# Patient Record
Sex: Male | Born: 1960 | Race: White | Hispanic: No | Marital: Single | Smoking: Former smoker
Health system: Southern US, Community
[De-identification: ages and names within clinical notes are randomized; demographics above are authoritative.]

## PROBLEM LIST (undated history)

## (undated) DIAGNOSIS — G43909 Migraine, unspecified, not intractable, without status migrainosus: Secondary | ICD-10-CM

## (undated) DIAGNOSIS — B019 Varicella without complication: Secondary | ICD-10-CM

## (undated) DIAGNOSIS — E785 Hyperlipidemia, unspecified: Secondary | ICD-10-CM

## (undated) HISTORY — DX: Varicella without complication: B01.9

## (undated) HISTORY — DX: Hyperlipidemia, unspecified: E78.5

## (undated) HISTORY — DX: Migraine, unspecified, not intractable, without status migrainosus: G43.909

---

## 2002-06-14 ENCOUNTER — Emergency Department (HOSPITAL_COMMUNITY): Admission: EM | Admit: 2002-06-14 | Discharge: 2002-06-14 | Payer: Self-pay | Admitting: Emergency Medicine

## 2004-09-21 ENCOUNTER — Ambulatory Visit: Payer: Self-pay | Admitting: Family Medicine

## 2004-10-14 ENCOUNTER — Emergency Department (HOSPITAL_COMMUNITY): Admission: EM | Admit: 2004-10-14 | Discharge: 2004-10-14 | Payer: Self-pay | Admitting: Emergency Medicine

## 2004-12-23 ENCOUNTER — Ambulatory Visit: Payer: Self-pay | Admitting: Family Medicine

## 2005-08-24 ENCOUNTER — Ambulatory Visit: Payer: Self-pay | Admitting: Family Medicine

## 2005-12-02 ENCOUNTER — Ambulatory Visit: Payer: Self-pay | Admitting: Family Medicine

## 2006-03-17 ENCOUNTER — Ambulatory Visit: Payer: Self-pay | Admitting: Family Medicine

## 2006-04-13 ENCOUNTER — Encounter: Payer: Self-pay | Admitting: Family Medicine

## 2006-08-29 ENCOUNTER — Ambulatory Visit: Payer: Self-pay | Admitting: Family Medicine

## 2006-09-16 ENCOUNTER — Ambulatory Visit: Payer: Self-pay | Admitting: Family Medicine

## 2006-09-16 LAB — CONVERTED CEMR LAB
ALT: 27 units/L (ref 0–40)
AST: 23 units/L (ref 0–37)
Albumin: 4.3 g/dL (ref 3.5–5.2)
Alkaline Phosphatase: 65 units/L (ref 39–117)
BUN: 17 mg/dL (ref 6–23)
Basophils Absolute: 0 10*3/uL (ref 0.0–0.1)
Basophils Relative: 0 % (ref 0.0–1.0)
CO2: 30 meq/L (ref 19–32)
Calcium: 9.8 mg/dL (ref 8.4–10.5)
Chloride: 106 meq/L (ref 96–112)
Creatinine, Ser: 1 mg/dL (ref 0.4–1.5)
Eosinophil percent: 5.3 % — ABNORMAL HIGH (ref 0.0–5.0)
GFR calc non Af Amer: 86 mL/min
Glomerular Filtration Rate, Af Am: 104 mL/min/{1.73_m2}
Glucose, Bld: 95 mg/dL (ref 70–99)
HCT: 50.9 % (ref 39.0–52.0)
Hemoglobin: 17 g/dL (ref 13.0–17.0)
Lymphocytes Relative: 36 % (ref 12.0–46.0)
MCHC: 33.3 g/dL (ref 30.0–36.0)
MCV: 95.9 fL (ref 78.0–100.0)
Monocytes Absolute: 0.5 10*3/uL (ref 0.2–0.7)
Monocytes Relative: 6.8 % (ref 3.0–11.0)
Neutro Abs: 3.9 10*3/uL (ref 1.4–7.7)
Neutrophils Relative %: 51.9 % (ref 43.0–77.0)
PSA: 0.77 ng/mL (ref 0.10–4.00)
Platelets: 222 10*3/uL (ref 150–400)
Potassium: 4.1 meq/L (ref 3.5–5.1)
RBC: 5.31 M/uL (ref 4.22–5.81)
RDW: 11.8 % (ref 11.5–14.6)
Sodium: 141 meq/L (ref 135–145)
TSH: 0.41 microintl units/mL (ref 0.35–5.50)
Total Bilirubin: 1.7 mg/dL — ABNORMAL HIGH (ref 0.3–1.2)
Total Protein: 7 g/dL (ref 6.0–8.3)
WBC: 7.5 10*3/uL (ref 4.5–10.5)

## 2006-09-21 ENCOUNTER — Encounter: Payer: Self-pay | Admitting: Family Medicine

## 2006-11-16 ENCOUNTER — Encounter: Payer: Self-pay | Admitting: Family Medicine

## 2007-02-23 ENCOUNTER — Ambulatory Visit: Payer: Self-pay | Admitting: Family Medicine

## 2007-02-27 ENCOUNTER — Encounter: Payer: Self-pay | Admitting: Family Medicine

## 2007-03-07 DIAGNOSIS — L719 Rosacea, unspecified: Secondary | ICD-10-CM | POA: Insufficient documentation

## 2007-03-07 DIAGNOSIS — E785 Hyperlipidemia, unspecified: Secondary | ICD-10-CM | POA: Insufficient documentation

## 2007-03-07 DIAGNOSIS — E7210 Disorders of sulfur-bearing amino-acid metabolism, unspecified: Secondary | ICD-10-CM | POA: Insufficient documentation

## 2007-05-10 ENCOUNTER — Telehealth (INDEPENDENT_AMBULATORY_CARE_PROVIDER_SITE_OTHER): Payer: Self-pay | Admitting: *Deleted

## 2007-06-06 ENCOUNTER — Telehealth: Payer: Self-pay | Admitting: Family Medicine

## 2007-07-03 ENCOUNTER — Encounter: Payer: Self-pay | Admitting: Family Medicine

## 2007-07-03 ENCOUNTER — Telehealth (INDEPENDENT_AMBULATORY_CARE_PROVIDER_SITE_OTHER): Payer: Self-pay | Admitting: *Deleted

## 2008-06-21 ENCOUNTER — Emergency Department: Payer: Self-pay | Admitting: Emergency Medicine

## 2008-10-23 ENCOUNTER — Emergency Department (HOSPITAL_COMMUNITY): Admission: EM | Admit: 2008-10-23 | Discharge: 2008-10-24 | Payer: Self-pay | Admitting: Emergency Medicine

## 2011-02-01 LAB — POCT CARDIAC MARKERS
CKMB, poc: 1.1 ng/mL (ref 1.0–8.0)
CKMB, poc: <1 ng/mL — ABNORMAL LOW (ref 1.0–8.0)
CKMB, poc: <1 ng/mL — ABNORMAL LOW (ref 1.0–8.0)
Myoglobin, poc: 38.8 ng/mL (ref 12–200)
Myoglobin, poc: 43.3 ng/mL (ref 12–200)
Myoglobin, poc: 54.2 ng/mL (ref 12–200)
Troponin i, poc: <0.05 ng/mL (ref 0.00–0.09)
Troponin i, poc: <0.05 ng/mL (ref 0.00–0.09)
Troponin i, poc: <0.05 ng/mL (ref 0.00–0.09)

## 2011-02-01 LAB — POCT I-STAT, CHEM 8
BUN: 26 mg/dL — ABNORMAL HIGH (ref 6–23)
Calcium, Ion: 1.15 mmol/L (ref 1.12–1.32)
Chloride: 102 mEq/L (ref 96–112)
Creatinine, Ser: 1.3 mg/dL (ref 0.4–1.5)
Glucose, Bld: 92 mg/dL (ref 70–99)
HCT: 49 % (ref 39.0–52.0)
Hemoglobin: 16.7 g/dL (ref 13.0–17.0)
Potassium: 3.8 mEq/L (ref 3.5–5.1)
Sodium: 139 mEq/L (ref 135–145)
TCO2: 27 mmol/L (ref 0–100)

## 2011-03-05 NOTE — Letter (Signed)
July 03, 2007    Mr. Erik Weaver  [address]  [place], [place] [address]   RECOLLYN, RIBAS  MRN:  [id number]  /  DOB:  1960/10/31   r   Dear Mr. Erik Weaver:     It has come to my attention that you called the office asking for an  appointment for labs.  I have requested you make an appointment to see  me.  Your last NMR profile from [facility] showed you had 12-15 %  increased risk of heart attack or stroke.  You had concerns about  increasing your dose of medication but refused to talk to me on the  phone or come in to discuss the issue.  I can not treat you but just  looking at labs.  We have attempted on several occassions to speak with  you and you have not returned any of our calls.  My concern is your  health.  Please call our office and let us know the name of a Doctor  whom you would prefer to see so we can transfer your records and  facilitate addressing this health risk.  We will be able to provide care  for the next 30 days only.    Sincerely,      Lelon Perla, DO  Electronically Signed    Shawnie Dapper  DD: 07/03/2007  DT: 07/04/2007  Job #: [id number]

## 2013-04-17 ENCOUNTER — Ambulatory Visit: Payer: Self-pay | Admitting: Gastroenterology

## 2014-03-08 ENCOUNTER — Ambulatory Visit: Payer: Self-pay | Admitting: Family Medicine

## 2015-04-07 ENCOUNTER — Telehealth: Payer: Self-pay | Admitting: Family Medicine

## 2015-04-07 NOTE — Telephone Encounter (Signed)
Dr Sherryll Burger,   Request for Vitorin refill. Last office visit 03-08-14

## 2015-04-07 NOTE — Telephone Encounter (Signed)
Patient needs an office visit appointment to check his cholesterol.

## 2015-04-07 NOTE — Telephone Encounter (Signed)
Patient requesting refill on Vytorin 10/40 to be sent to [facility] [address]

## 2015-04-08 NOTE — Telephone Encounter (Signed)
Patient informed and states that he just did a full blood panel, and patient has already been given his results. Patient states he just took his last pill last night and he is completely out.

## 2015-04-09 ENCOUNTER — Other Ambulatory Visit: Payer: Self-pay | Admitting: *Deleted

## 2015-04-09 DIAGNOSIS — E7800 Pure hypercholesterolemia, unspecified: Secondary | ICD-10-CM

## 2015-04-09 MED ORDER — EZETIMIBE-SIMVASTATIN 10-40 MG PO TABS: 1.0000 | ORAL_TABLET | Freq: Every day | ORAL | Status: DC

## 2015-04-09 NOTE — Telephone Encounter (Signed)
Patient was seen on 03/14/2015. Documentation in [employer]. Okay to refill Vytorin 10-40 mg 1 tablet at bedtime.

## 2015-04-09 NOTE — Telephone Encounter (Signed)
See patient response.  I can't find any documentation of his remarks.

## 2015-07-30 ENCOUNTER — Telehealth: Payer: Self-pay | Admitting: Family Medicine

## 2015-07-30 DIAGNOSIS — E7800 Pure hypercholesterolemia, unspecified: Secondary | ICD-10-CM

## 2015-07-30 NOTE — Telephone Encounter (Signed)
Pt needs refill on [facility] 10/[facility] [address] Pt has not been seen since May 2016 for a physical.

## 2015-07-31 MED ORDER — EZETIMIBE-SIMVASTATIN 10-40 MG PO TABS: 1.0000 | ORAL_TABLET | Freq: Every day | ORAL | Status: DC

## 2015-07-31 NOTE — Telephone Encounter (Signed)
Prescription for Vytorin 10-40 sent to pharmacy

## 2015-08-01 NOTE — Telephone Encounter (Signed)
Medication has been refilled and sent to [facility] [address], patient has been notified °

## 2015-10-31 ENCOUNTER — Encounter: Payer: Self-pay | Admitting: Family Medicine

## 2015-10-31 ENCOUNTER — Ambulatory Visit (INDEPENDENT_AMBULATORY_CARE_PROVIDER_SITE_OTHER): Payer: Commercial Managed Care - HMO | Admitting: Family Medicine

## 2015-10-31 VITALS — BP 118/72 | HR 71 | Temp 98.0°F | Ht 67.0 in | Wt 195.8 lb

## 2015-10-31 DIAGNOSIS — Z23 Encounter for immunization: Secondary | ICD-10-CM

## 2015-10-31 DIAGNOSIS — E785 Hyperlipidemia, unspecified: Secondary | ICD-10-CM

## 2015-10-31 DIAGNOSIS — E669 Obesity, unspecified: Secondary | ICD-10-CM

## 2015-10-31 DIAGNOSIS — Z7189 Other specified counseling: Secondary | ICD-10-CM | POA: Diagnosis not present

## 2015-10-31 DIAGNOSIS — Z7689 Persons encountering health services in other specified circumstances: Secondary | ICD-10-CM | POA: Insufficient documentation

## 2015-10-31 NOTE — Assessment & Plan Note (Signed)
Overall patient is doing quite well. Discussed diet and exercise. Notes his only major medical issue is hyperlipidemia which is documented in the hyperlipidemia problem list. Reviewed past surgeries which are none. Reviewed family medical history and this was updated. Reviewed social history and this was updated. He was given a flu shot and Tdap. He will return in May for a physical.

## 2015-10-31 NOTE — Assessment & Plan Note (Signed)
BMI just over 30. Discussed diet and exercise at length. Sounds like patient knows exactly what he needs to do, it is just a matter of doing it more consistently. We'll continue to monitor.

## 2015-10-31 NOTE — Progress Notes (Signed)
Pre visit review using our clinic review tool, if applicable. No additional management support is needed unless otherwise documented below in the visit note. 

## 2015-10-31 NOTE — Patient Instructions (Addendum)
Nice to meet you. Please work on the consistency of your diet and exercise. You are already doing a great job. I will see you back in May for a physical.  Please let us know when you need refills.

## 2015-10-31 NOTE — Progress Notes (Signed)
Patient ID: Erik Weaver, male   DOB: 05-12-1961, 55 y.o.   MRN: [id number]  Erik AlarEric Maven Varelas, MD Phone: [phone]  Erik Weaver is a 55 y.o. male who presents today for new patient visit.  Patient presents as a new patient. Notes he was satisfied with his previous physician and wanted to have somebody that would work more on prevention with him. He notes his only major medical issue is his elevated cholesterol. Notes it was high when he is in his 30s and he was started on Vytorin. He notes he takes this daily. He also takes niacin and fish oil. He notes his father had a history of heart disease and had CABG. He denies chest pain, shortness of breath, muscle pain, and abdominal pain. He notes he monitors his diet relatively closely most the time, though over the last several months with the holidays and the weather it is difficult to do this. He typically sticks to high-protein low-fat diet. Eats 4-6 meals a day. Tries to eat vegetables and when he doesn't he drinks V8. Notes he does exercise 3-4 days a week by going to the gym and lifting weights and doing cardio. Notes in the past he has lost 30 pounds, though he has put a fair amount of it back on and the last 2 months.  Active Ambulatory Problems    Diagnosis Date Noted  . 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 Hyperlipidemia 03/07/2007  . 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 Establishing care with new doctor, encounter for 10/31/2015  . Obesity (BMI 30.0-34.9) 10/31/2015   Resolved Ambulatory Problems    Diagnosis Date Noted  . No Resolved Ambulatory Problems   Past Medical History  Diagnosis Date  . Chickenpox   . Migraines     Family History  Problem Relation Age of Onset  . Alcoholism      Parent, other relative  . Arthritis      Parent, Other relative   . Colon cancer Father   . Lung cancer      Grandparent  . Hyperlipidemia      Parent, other relative  . Heart disease Father   . Hypertension      Parent, grandparent, other relative  .  Diabetes      Parent, grandparent     Social History   Social History  . Marital Status: Single    Spouse Name: N/A  . Number of Children: N/A  . Years of Education: N/A   Occupational History  . Not on file.   Social History Main Topics  . Smoking status: Former Games developermoker  . Smokeless tobacco: Former NeurosurgeonUser    Types: Chew  . Alcohol Use: 0.0 oz/week    0 Standard drinks or equivalent per week     Comment: Infrequent  . Drug Use: No  . Sexual Activity: Not on file   Other Topics Concern  . Not on file   Social History Narrative  . No narrative on file    ROS  General:  Negative for unexplained weight loss, fever Skin: Negative for new or changing mole, sore that won't heal HEENT: Negative for trouble hearing, trouble seeing, ringing in ears, mouth sores, hoarseness, change in voice, dysphagia. CV:  Negative for chest pain, dyspnea, edema, palpitations Resp: Negative for cough, dyspnea, hemoptysis GI: Negative for nausea, vomiting, diarrhea, constipation, abdominal pain, melena, hematochezia. GU: Negative for dysuria, incontinence, urinary hesitance, hematuria, vaginal or penile discharge, polyuria, sexual difficulty, lumps in testicle or breasts MSK: Negative for muscle cramps  or aches, joint pain or swelling Neuro: Negative for headaches, weakness, numbness, dizziness, passing out/fainting Psych: Negative for depression, anxiety, memory problems  Objective  Physical Exam Filed Vitals:   10/31/15 0910  BP: 118/72  Pulse: 71  Temp: 98 F (36.7 C)    BP Readings from Last 3 Encounters:  10/31/15 118/72   Wt Readings from Last 3 Encounters:  10/31/15 195 lb 12.8 oz (88.814 kg)    Physical Exam  Constitutional: He is well-developed, well-nourished, and in no distress.  HENT:  Head: Normocephalic and atraumatic.  Right Ear: External ear normal.  Left Ear: External ear normal.  Mouth/Throat: Oropharynx is clear and moist. No oropharyngeal exudate.  Eyes:  Conjunctivae are normal. Pupils are equal, round, and reactive to light.  Neck: Neck supple.  Cardiovascular: Normal rate, regular rhythm and normal heart sounds.  Exam reveals no gallop and no friction rub.   No murmur heard. Pulmonary/Chest: Effort normal and breath sounds normal. No respiratory distress. He has no wheezes. He has no rales.  Abdominal: Soft. Bowel sounds are normal. He exhibits no distension. There is no tenderness. There is no rebound and no guarding.  Musculoskeletal: He exhibits no edema.  Lymphadenopathy:    He has no cervical adenopathy.  Neurological: He is alert. Gait normal.  Skin: Skin is warm and dry. He is not diaphoretic.  Psychiatric: Mood and affect normal.     Assessment/Plan:   Hyperlipidemia Tolerating medications well. Asymptomatic. We will request records to see what his last lipid panel was. Plan to recheck lipids at his physical in May.  Establishing care with new doctor, encounter for Overall patient is doing quite well. Discussed diet and exercise. Notes his only major medical issue is hyperlipidemia which is documented in the hyperlipidemia problem list. Reviewed past surgeries which are none. Reviewed family medical history and this was updated. Reviewed social history and this was updated. He was given a flu shot and Tdap. He will return in May for a physical.  Obesity (BMI 30.0-34.9) BMI just over 30. Discussed diet and exercise at length. Sounds like patient knows exactly what he needs to do, it is just a matter of doing it more consistently. We'll continue to monitor.    Orders Placed This Encounter  Procedures  . Flu Vaccine QUAD 36+ mos IM  . Tdap vaccine greater than or equal to 7yo IM    [employer]

## 2015-10-31 NOTE — Assessment & Plan Note (Signed)
Tolerating medications well. Asymptomatic. We will request records to see what his last lipid panel was. Plan to recheck lipids at his physical in May.

## 2015-11-28 ENCOUNTER — Other Ambulatory Visit: Payer: Self-pay

## 2015-11-28 DIAGNOSIS — E7800 Pure hypercholesterolemia, unspecified: Secondary | ICD-10-CM

## 2015-11-28 MED ORDER — EZETIMIBE-SIMVASTATIN 10-40 MG PO TABS: 1.0000 | ORAL_TABLET | Freq: Every day | ORAL | Status: DC

## 2015-11-28 NOTE — Telephone Encounter (Signed)
FYI: Pt has been scheduled to come in for fasting blood work 12/12/15

## 2015-11-28 NOTE — Telephone Encounter (Signed)
Pt states he is questioning whether he needs to have bloodwork done in order to get a refill on cholesterol medication Vytorin 10-40mg . Please advise, thanks

## 2015-11-28 NOTE — Telephone Encounter (Signed)
He does need lab work to check his cholesterol and liver function. We can refill this for him and he can come in to have the lab work done fasting.

## 2015-12-12 ENCOUNTER — Telehealth: Payer: Self-pay | Admitting: Family Medicine

## 2015-12-12 ENCOUNTER — Other Ambulatory Visit (INDEPENDENT_AMBULATORY_CARE_PROVIDER_SITE_OTHER): Payer: Commercial Managed Care - HMO

## 2015-12-12 DIAGNOSIS — E7800 Pure hypercholesterolemia, unspecified: Secondary | ICD-10-CM | POA: Diagnosis not present

## 2015-12-12 NOTE — Telephone Encounter (Signed)
Noted  

## 2015-12-12 NOTE — Telephone Encounter (Signed)
Pt called about coming in this morning to get lab work done and pt states he observed nurse asking who was next in line. Pt sounded very dissatisfied . Pt states pt observed another pt having had a tourniquet on pt arm pt did not say if lab was drawn and nurse was looking for correct labels. Pt was there for several minutes and then walked out. Pt states it was not organized. Pt requested to speak to Dr Birdie Sons.   I apologized several times to pt about his experience today and I also explained that our regular lab tech is out and his he wanted to resch his lab appt. Pt stated no not at this time or until he speaks to Dr Birdie Sons. Call pt @ [phone].

## 2015-12-12 NOTE — Telephone Encounter (Signed)
Spoke with patient about being unsatisfied with the lab services today. I explained that we did not have our regular lab tech in the clinic today so we have to get people to fill in when this happens. When we have to get people to fill in they are not as familiar with the lab and how it runs so it might seem as smoothly run as when the regular person is in there. I told him that he was more than welcome to reschedule his labs for another day to when our regular lab tech is here or he could go to the [facility] office to have labs drawn. He stated that he would think about this. He was wanting to speak with Dr. Birdie Sons about this, but I explained that Dr. Birdie Sons was seeing patient and that I would pass on his concerns. I again apologized that he was dissatisfied with his experience.

## 2015-12-19 ENCOUNTER — Other Ambulatory Visit (INDEPENDENT_AMBULATORY_CARE_PROVIDER_SITE_OTHER): Payer: Commercial Managed Care - HMO

## 2015-12-19 ENCOUNTER — Telehealth: Payer: Self-pay | Admitting: Surgical

## 2015-12-19 DIAGNOSIS — E7800 Pure hypercholesterolemia, unspecified: Secondary | ICD-10-CM | POA: Diagnosis not present

## 2015-12-19 LAB — COMPREHENSIVE METABOLIC PANEL
ALT: 34 U/L (ref 0–53)
AST: 25 U/L (ref 0–37)
Albumin: 4.4 g/dL (ref 3.5–5.2)
Alkaline Phosphatase: 66 U/L (ref 39–117)
BUN: 18 mg/dL (ref 6–23)
CO2: 27 mEq/L (ref 19–32)
CREATININE: 1.13 mg/dL (ref 0.40–1.50)
Calcium: 9.5 mg/dL (ref 8.4–10.5)
Chloride: 106 mEq/L (ref 96–112)
GFR: 71.96 mL/min (ref >60.00)
Glucose, Bld: 99 mg/dL (ref 70–99)
Potassium: 4.3 mEq/L (ref 3.5–5.1)
Sodium: 140 mEq/L (ref 135–145)
Total Bilirubin: 1.1 mg/dL (ref 0.2–1.2)
Total Protein: 6.7 g/dL (ref 6.0–8.3)

## 2015-12-19 LAB — LIPID PANEL
CHOL/HDL RATIO: 3
Cholesterol: 154 mg/dL (ref 0–200)
HDL: 60.3 mg/dL (ref >39.00)
LDL Cholesterol: 68 mg/dL (ref 0–99)
NONHDL: 93.72
Triglycerides: 130 mg/dL (ref 0.0–149.0)
VLDL: 26 mg/dL (ref 0.0–40.0)

## 2015-12-19 NOTE — Telephone Encounter (Signed)
Notified patient of lab results 

## 2016-03-05 ENCOUNTER — Encounter: Payer: Commercial Managed Care - HMO | Admitting: Family Medicine

## 2016-03-05 ENCOUNTER — Ambulatory Visit (INDEPENDENT_AMBULATORY_CARE_PROVIDER_SITE_OTHER): Payer: Commercial Managed Care - HMO | Admitting: Family Medicine

## 2016-03-05 ENCOUNTER — Encounter: Payer: Self-pay | Admitting: Family Medicine

## 2016-03-05 VITALS — BP 110/76 | HR 64 | Temp 98.2°F | Ht 67.0 in | Wt 200.8 lb

## 2016-03-05 DIAGNOSIS — Z125 Encounter for screening for malignant neoplasm of prostate: Secondary | ICD-10-CM | POA: Diagnosis not present

## 2016-03-05 DIAGNOSIS — R5382 Chronic fatigue, unspecified: Secondary | ICD-10-CM

## 2016-03-05 DIAGNOSIS — Z0000 Encounter for general adult medical examination without abnormal findings: Secondary | ICD-10-CM

## 2016-03-05 DIAGNOSIS — Z0001 Encounter for general adult medical examination with abnormal findings: Secondary | ICD-10-CM

## 2016-03-05 DIAGNOSIS — Z114 Encounter for screening for human immunodeficiency virus [HIV]: Secondary | ICD-10-CM

## 2016-03-05 DIAGNOSIS — Z1159 Encounter for screening for other viral diseases: Secondary | ICD-10-CM

## 2016-03-05 LAB — CBC
HCT: 47.9 % (ref 39.0–52.0)
Hemoglobin: 16.3 g/dL (ref 13.0–17.0)
MCHC: 34 g/dL (ref 30.0–36.0)
MCV: 93.9 fl (ref 78.0–100.0)
Platelets: 207 10*3/uL (ref 150.0–400.0)
RBC: 5.1 Mil/uL (ref 4.22–5.81)
RDW: 12.5 % (ref 11.5–15.5)
WBC: 7.5 10*3/uL (ref 4.0–10.5)

## 2016-03-05 LAB — PSA: PSA: 0.63 ng/mL (ref 0.10–4.00)

## 2016-03-05 LAB — TSH: TSH: 0.4 u[IU]/mL (ref 0.35–4.50)

## 2016-03-05 LAB — TESTOSTERONE: Testosterone: 433.37 ng/dL (ref 300.00–890.00)

## 2016-03-05 NOTE — Progress Notes (Signed)
Patient ID: Erik Weaver, male   DOB: May 01, 1961, 55 y.o.   MRN: [id number]  Erik Alar, MD Phone: [phone]  Erik Weaver is a 55 y.o. male who presents today for physical exam.  Exercise: Patient notes exercising about 50% of the month. Does some weightlifting and cardio. Diet typically consists of cereal, protein shake, or oatmeal in the morning. Nabs or protein bar for a snack. Eats lunch out. Dinner consists of healthy choice prepackaged meal, cereal, or protein shake. Tricking half-and-half sweet tea. Very few vegetables. Some fruit. Has been unable to lose weight like he previously has. Has decreased energy which he thinks feeds into this. Just does not have a desire to do as much as he used to to help his weight. Colonoscopy 2014 Tetanus vaccine earlier this year. No prior HIV or hepatitis C screening. No family history of prostate cancer. Family history of lung, brain, and stomach cancer in his maternal grandfather. Father had colon cancer diagnosed at age 50.  Active Ambulatory Problems    Diagnosis Date Noted  . 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 Hyperlipidemia 03/07/2007  . 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 Establishing care with new doctor, encounter for 10/31/2015  . Obesity (BMI 30.0-34.9) 10/31/2015  . Encounter for general adult medical examination with abnormal findings 03/05/2016   Resolved Ambulatory Problems    Diagnosis Date Noted  . No Resolved Ambulatory Problems   Past Medical History  Diagnosis Date  . Chickenpox   . Migraines     Family History  Problem Relation Age of Onset  . Alcoholism      Parent, other relative  . Arthritis      Parent, Other relative   . Colon cancer Father   . Lung cancer      Grandparent  . Hyperlipidemia      Parent, other relative  . Heart disease Father   . Hypertension      Parent, grandparent, other relative  . Diabetes      Parent, grandparent     Social History   Social History  . Marital Status: Single   Spouse Name: N/A  . Number of Children: N/A  . Years of Education: N/A   Occupational History  . Not on file.   Social History Main Topics  . Smoking status: Former Games developer  . Smokeless tobacco: Former Neurosurgeon    Types: Chew  . Alcohol Use: 0.0 oz/week    0 Standard drinks or equivalent per week     Comment: Infrequent  . Drug Use: No  . Sexual Activity: Not on file   Other Topics Concern  . Not on file   Social History Narrative    ROS  General:  Negative for nexplained weight loss, fever Skin: Negative for new or changing mole, sore that won't heal HEENT: Negative for trouble hearing, trouble seeing, ringing in ears, mouth sores, hoarseness, change in voice, dysphagia. CV:  Negative for chest pain, dyspnea, edema, palpitations Resp: Negative for cough, dyspnea, hemoptysis GI: Negative for nausea, vomiting, diarrhea, constipation, abdominal pain, melena, hematochezia. GU: Negative for dysuria, incontinence, urinary hesitance, hematuria, vaginal or penile discharge, polyuria, sexual difficulty, lumps in testicle or breasts MSK: Negative for muscle cramps or aches, joint pain or swelling Neuro: Negative for headaches, weakness, numbness, dizziness, passing out/fainting Psych: Negative for depression, anxiety, memory problems  Objective  Physical Exam Filed Vitals:   03/05/16 0821  BP: 110/76  Pulse: 64  Temp: 98.2 F (36.8 C)    BP Readings from Last  3 Encounters:  03/05/16 110/76  10/31/15 118/72   Wt Readings from Last 3 Encounters:  03/05/16 200 lb 12.8 oz (91.082 kg)  10/31/15 195 lb 12.8 oz (88.814 kg)    Physical Exam  Constitutional: He is well-developed, well-nourished, and in no distress.  HENT:  Head: Normocephalic and atraumatic.  Right Ear: External ear normal.  Left Ear: External ear normal.  Mouth/Throat: Oropharynx is clear and moist.  Eyes: Conjunctivae are normal. Pupils are equal, round, and reactive to light.  Cardiovascular: Normal rate,  regular rhythm and normal heart sounds.   Pulmonary/Chest: Effort normal and breath sounds normal.  Abdominal: Soft. Bowel sounds are normal. He exhibits no distension. There is no tenderness. There is no rebound and no guarding.  Left lateral umbilicus with small area of fat enlargement with no protrusion on Valsalva and no noted defect in the abdominal wall  Musculoskeletal: He exhibits no edema.  Neurological: He is alert. Gait normal.  Skin: Skin is warm and dry. He is not diaphoretic.  Psychiatric: Mood and affect normal.     Assessment/Plan:   Encounter for general adult medical examination with abnormal findings Overall doing well. BMI is in the obese range. Discussed diet and exercise at length. Patient will try to increase his exercise. He will change his midday meal and try to avoid the bad snacks that he states he was eating. Up-to-date on colonoscopy. Up-to-date on tetanus vaccine. Will order HIV and hepatitis C as outlined below. PSA ordered. Patient declined rectal exam today. Also notes some chronic fatigue likely related to obesity and aging though will order lab work as outlined below to evaluate further. We will see patient back in 3 months to follow-up on his weight and diet and exercise.    Orders Placed This Encounter  Procedures  . PSA  . TSH  . CBC  . Testosterone  . HIV antibody (with reflex)  . Hepatitis C Antibody    Erik AlarEric Maribell Demeo, MD [facility]

## 2016-03-05 NOTE — Progress Notes (Signed)
Pre visit review using our clinic review tool, if applicable. No additional management support is needed unless otherwise documented below in the visit note. 

## 2016-03-05 NOTE — Assessment & Plan Note (Addendum)
Overall doing well. BMI is in the obese range. Discussed diet and exercise at length. Patient will try to increase his exercise. He will change his midday meal and try to avoid the bad snacks that he states he was eating. Up-to-date on colonoscopy. Up-to-date on tetanus vaccine. Will order HIV and hepatitis C as outlined below. PSA ordered. Patient declined rectal exam today. Also notes some chronic fatigue likely related to obesity and aging though will order lab work as outlined below to evaluate further. We will see patient back in 3 months to follow-up on his weight and diet and exercise.

## 2016-03-05 NOTE — Patient Instructions (Signed)
Nice to see you. Please try to increase her exercise as you're able to. Also make changes to your midday meal 2-3 days a week to start with. We'll check some lab work and call with the results.

## 2016-03-06 LAB — HEPATITIS C ANTIBODY: HCV Ab: NEGATIVE

## 2016-03-06 LAB — HIV ANTIBODY (ROUTINE TESTING W REFLEX): HIV 1&2 Ab, 4th Generation: NONREACTIVE

## 2016-03-09 ENCOUNTER — Telehealth: Payer: Self-pay | Admitting: Family Medicine

## 2016-03-09 ENCOUNTER — Encounter: Payer: Self-pay | Admitting: Family Medicine

## 2016-03-09 NOTE — Telephone Encounter (Signed)
The generic form should be just as effective as the brand-name form. This should be okay.

## 2016-03-09 NOTE — Telephone Encounter (Signed)
Pt had his ezetimibe-simvastatin (VYTORIN) 10-40 MG tablet [facility] [address] [facility]. [facility] that [facility]illed with a [facility]. [facility] told him that Dr. Birdie SonsSonnenberg said it was ok to be filled with generic drug. Please call pt on cell phone. He wants to know if this is true.

## 2016-03-09 NOTE — Telephone Encounter (Signed)
Left message on machine for patient to return our call 

## 2016-03-10 NOTE — Telephone Encounter (Signed)
Spoke with patient and he stated that he would rather have the brand name of the prescription. I explained that would be fine if he would like to take the name brand. It will be more expensive and he might have trouble with insurance paying, but he said that he is fine with this. He has enough medication to last for the month and when he needs new prescription he will call or pharmacy will send in refill

## 2016-04-05 ENCOUNTER — Telehealth: Payer: Self-pay | Admitting: *Deleted

## 2016-04-05 DIAGNOSIS — E7800 Pure hypercholesterolemia, unspecified: Secondary | ICD-10-CM

## 2016-04-05 MED ORDER — EZETIMIBE-SIMVASTATIN 10-40 MG PO TABS: 1.0000 | ORAL_TABLET | Freq: Every day | ORAL | Status: DC

## 2016-04-05 NOTE — Telephone Encounter (Signed)
Refill of medication

## 2016-04-05 NOTE — Telephone Encounter (Signed)
Patient has requested a medication refill for Vytorin, he requested the the name brand of this medication,tier1.  Pharmacy [facility] [address]

## 2016-04-09 MED ORDER — EZETIMIBE-SIMVASTATIN 10-40 MG PO TABS: 1.0000 | ORAL_TABLET | Freq: Every day | ORAL | Status: DC

## 2016-04-09 NOTE — Addendum Note (Signed)
Addended byElise Benne: Shanetta Nicolls T on: 04/09/2016 01:54 PM   Modules accepted: Orders

## 2016-04-09 NOTE — Telephone Encounter (Signed)
Medication refill

## 2016-04-09 NOTE — Telephone Encounter (Signed)
[facility]. Patient has two,doses left.

## 2016-06-01 ENCOUNTER — Telehealth: Payer: Self-pay | Admitting: *Deleted

## 2016-06-01 NOTE — Telephone Encounter (Signed)
Patient wanted to have Dr. Birdie SonsSonnenberg opinion for the supplements tomatanine and ursolic acid.  Patient would like to discuss these supplements at his next visit

## 2016-06-01 NOTE — Telephone Encounter (Signed)
Please advise, thanks.

## 2016-06-02 NOTE — Telephone Encounter (Signed)
I am not familiar with these supplements. As with all supplements there is very little regulation or testing of the supplements and I typically do not recommend that patients take supplements unless advised by a physician due to unknown side effects and unknown compounding of the supplements. I can discuss further with the patient at his next visit if he would like. Thanks.

## 2016-06-02 NOTE — Telephone Encounter (Signed)
Spoke with the patient advised of Dr. Purvis SheffieldSonnenberg's message thanks

## 2016-06-04 ENCOUNTER — Encounter: Payer: Self-pay | Admitting: Family Medicine

## 2016-06-04 ENCOUNTER — Ambulatory Visit (INDEPENDENT_AMBULATORY_CARE_PROVIDER_SITE_OTHER): Payer: Commercial Managed Care - HMO | Admitting: Family Medicine

## 2016-06-04 DIAGNOSIS — E669 Obesity, unspecified: Secondary | ICD-10-CM

## 2016-06-04 DIAGNOSIS — E785 Hyperlipidemia, unspecified: Secondary | ICD-10-CM

## 2016-06-04 NOTE — Patient Instructions (Signed)
Nice to see you. I would avoid any supplements that are not evaluated by the FDA and approved by the FDA. Please start to work on your evening diet and begin to exercise more consistently.

## 2016-06-04 NOTE — Progress Notes (Signed)
Pre visit review using our clinic review tool, if applicable. No additional management support is needed unless otherwise documented below in the visit note. 

## 2016-06-04 NOTE — Assessment & Plan Note (Signed)
Tolerating medications. Asymptomatic. Continue current medications.

## 2016-06-04 NOTE — Progress Notes (Signed)
  Erik AlarEric Quentavious Rittenhouse, MD Phone: [phone]  Erik Weaver is a 55 y.o. male who presents today for follow-up.  HYPERLIPIDEMIA Symptoms Chest pain on exertion:  No   Leg claudication:   No Medications: Compliance- taking Vytorin, niacin, and fish oil Right upper quadrant pain- no  Muscle aches- no  Obesity: Has not made much progress with his diet. Notes he still feels terribly in the evening. Notes this will likely change in the next several weeks as several of his family members will be moving out of his house. States fairly active Friday through Sunday though not able to exercise much and we given his job. He also has questions regarding several supplements, tomatadine and ursolic acid. He has done quite a bit of research and notes these [facility] for their muscle maintaining properties and muscle building properties. He is not using these at this time.   ROS see history of present illness  Objective  Physical Exam Vitals:   06/04/16 0841  BP: 118/66  Pulse: 60  Temp: 97.9 F (36.6 C)    BP Readings from Last 3 Encounters:  06/04/16 118/66  03/05/16 110/76  10/31/15 118/72   Wt Readings from Last 3 Encounters:  06/04/16 196 lb 12.8 oz (89.3 kg)  03/05/16 200 lb 12.8 oz (91.1 kg)  10/31/15 195 lb 12.8 oz (88.8 kg)    Physical Exam  Constitutional: No distress.  Cardiovascular: Normal rate, regular rhythm and normal heart sounds.   Pulmonary/Chest: Effort normal and breath sounds normal.  Musculoskeletal: He exhibits no edema.  Neurological: He is alert. Gait normal.  Skin: He is not diaphoretic.     Assessment/Plan: Please see individual problem list.  Hyperlipidemia Tolerating medications. Asymptomatic. Continue current medications.  Obesity (BMI 30.0-34.9) Has not made many strides with his diet. He is going to work on this further. He'll continue to exercise. Advised not to use any supplements that are not  evaluated and approved by the FDA. Though did advise that he could take supplemental vitamins and amino acids.   Erik AlarEric Romari Gasparro, MD [facility]

## 2016-06-04 NOTE — Assessment & Plan Note (Addendum)
Has not made many strides with his diet. He is going to work on this further. He'll continue to exercise. Advised not to use any supplements that are not evaluated and approved by the FDA. Though did advise that he could take supplemental vitamins and amino acids.

## 2016-06-24 ENCOUNTER — Encounter: Payer: Self-pay | Admitting: Emergency Medicine

## 2016-06-24 ENCOUNTER — Emergency Department
Admission: EM | Admit: 2016-06-24 | Discharge: 2016-06-24 | Disposition: A | Discharge status: Home / Self Care | Payer: Commercial Managed Care - HMO | Attending: Student in an Organized Health Care Education/Training Program | Admitting: Student in an Organized Health Care Education/Training Program

## 2016-06-24 DIAGNOSIS — R42 Dizziness and giddiness: Secondary | ICD-10-CM | POA: Diagnosis not present

## 2016-06-24 DIAGNOSIS — Z5321 Procedure and treatment not carried out due to patient leaving prior to being seen by health care provider: Secondary | ICD-10-CM | POA: Insufficient documentation

## 2016-06-24 DIAGNOSIS — Z87891 Personal history of nicotine dependence: Secondary | ICD-10-CM | POA: Diagnosis not present

## 2016-06-24 LAB — CBC
HEMATOCRIT: 46 % (ref 40.0–52.0)
HEMOGLOBIN: 16.4 g/dL (ref 13.0–18.0)
MCH: 32.6 pg (ref 26.0–34.0)
MCHC: 35.7 g/dL (ref 32.0–36.0)
MCV: 91.1 fL (ref 80.0–100.0)
Platelets: 194 10*3/uL (ref 150–440)
RBC: 5.05 MIL/uL (ref 4.40–5.90)
RDW: 12.6 % (ref 11.5–14.5)
WBC: 11.2 10*3/uL — AB (ref 3.8–10.6)

## 2016-06-24 LAB — BASIC METABOLIC PANEL
Anion gap: 8 (ref 5–15)
BUN: 19 mg/dL (ref 6–20)
CHLORIDE: 107 mmol/L (ref 101–111)
CO2: 24 mmol/L (ref 22–32)
Calcium: 9.6 mg/dL (ref 8.9–10.3)
Creatinine, Ser: 0.97 mg/dL (ref 0.61–1.24)
GFR calc Af Amer: >60 mL/min (ref >60)
GFR calc non Af Amer: >60 mL/min (ref >60)
GLUCOSE: 118 mg/dL — AB (ref 65–99)
POTASSIUM: 3.7 mmol/L (ref 3.5–5.1)
SODIUM: 139 mmol/L (ref 135–145)

## 2016-06-24 LAB — TROPONIN I: Troponin I: <0.03 ng/mL (ref <0.03)

## 2016-06-24 NOTE — ED Triage Notes (Signed)
Pt states was at work when he became light headed. Pt states "over the last few weeks I've had a shooting pain in the L side of my head". Pt states this afternoon he began to feel light headed with a pressure behind his eyes. Pt states he has continued to feel light headed since this afternoon.

## 2016-06-25 ENCOUNTER — Other Ambulatory Visit: Payer: Self-pay | Admitting: Unknown Physician Specialty

## 2016-06-25 ENCOUNTER — Telehealth: Payer: Self-pay | Admitting: Emergency Medicine

## 2016-06-25 ENCOUNTER — Ambulatory Visit
Admission: RE | Admit: 2016-06-25 | Discharge: 2016-06-25 | Disposition: A | Discharge status: Home / Self Care | Payer: Commercial Managed Care - HMO | Source: Ambulatory Visit | Attending: Unknown Physician Specialty | Admitting: Unknown Physician Specialty

## 2016-06-25 DIAGNOSIS — R51 Headache: Secondary | ICD-10-CM | POA: Insufficient documentation

## 2016-06-25 DIAGNOSIS — R519 Headache, unspecified: Secondary | ICD-10-CM

## 2016-06-25 NOTE — Telephone Encounter (Signed)
Called patient due to lwot to inquire about condition and follow up plans. Left message with my number. 

## 2016-07-02 ENCOUNTER — Encounter: Payer: Self-pay | Admitting: Family Medicine

## 2016-07-02 ENCOUNTER — Ambulatory Visit (INDEPENDENT_AMBULATORY_CARE_PROVIDER_SITE_OTHER): Payer: Commercial Managed Care - HMO | Admitting: Family Medicine

## 2016-07-02 DIAGNOSIS — R519 Headache, unspecified: Secondary | ICD-10-CM

## 2016-07-02 DIAGNOSIS — R51 Headache: Secondary | ICD-10-CM

## 2016-07-02 NOTE — Assessment & Plan Note (Signed)
Patient with recent worsening of recurrence of headaches with left-sided twinges. Has had an extensive evaluation with no obvious cause. No acute changes on CT scan of his head last week. The description sounds as though it could be trigeminal neuralgia. Given that they've resolved with discontinuing Coke intake it could be that it was a reaction to some compound in the soft drink. Discussed given that he is asymptomatic at this time continuing to monitor. If they recur could consider treatment for trigeminal neuralgia versus referral to neurology. Given return precautions.

## 2016-07-02 NOTE — Progress Notes (Signed)
  Marikay AlarEric Karnell Vanderloop, MD Phone: [phone]  Glendell DockerRandall K Kalman is a 55 y.o. male who presents today for follow-up.  Patient notes he was evaluated last week in the emergency room and then at [facility] for left-sided headaches. Notes he has a long history of headaches and had significant migraines when he was a child. Occasionally gets migraines and tension headaches now. Intermittently over the last couple of years he will get a twinge of shooting discomfort in the left side of his head. Notes this started occurring more frequently about 10 days ago. Had some blurring of vision and fogginess with this. No nausea or vomiting. No numbness or weakness with this. He had lab work in the ED that revealed a minimally elevated white count though other workup was unremarkable. He did not wait for evaluation with the physician though he [facility] the next day and had a CT scan of his head that was negative for acute changes. Patient started thinking about any dietary changes after his workup was negative and noted he has been drinking a lot of Coke zero recently and has since discontinued this and has not had recurrence of this type of headache or any other headaches.   ROS see history of present illness  Objective  Physical Exam Vitals:   07/02/16 1114  BP: 118/72  Pulse: 73  Temp: 98.4 F (36.9 C)    BP Readings from Last 3 Encounters:  07/02/16 118/72  06/24/16 130/80  06/04/16 118/66   Wt Readings from Last 3 Encounters:  07/02/16 200 lb 12.8 oz (91.1 kg)  06/24/16 195 lb (88.5 kg)  06/04/16 196 lb 12.8 oz (89.3 kg)    Physical Exam  Constitutional: No distress.  HENT:  Head: Normocephalic and atraumatic.  Mouth/Throat: Oropharynx is clear and moist. No oropharyngeal exudate.  Eyes: Conjunctivae are normal. Pupils are equal, round, and reactive to light.  Cardiovascular: Normal rate, regular rhythm and normal heart sounds.   Pulmonary/Chest: Effort normal  and breath sounds normal.  Neurological: He is alert.  CN 2-12 intact, 5/5 strength in bilateral biceps, triceps, grip, quads, hamstrings, plantar and dorsiflexion, sensation to light touch intact in bilateral UE and LE, normal gait, 2+ patellar reflexes  Skin: Skin is warm and dry. He is not diaphoretic.     Assessment/Plan: Please see individual problem list.  Headache Patient with recent worsening of recurrence of headaches with left-sided twinges. Has had an extensive evaluation with no obvious cause. No acute changes on CT scan of his head last week. The description sounds as though it could be trigeminal neuralgia. Given that they've resolved with discontinuing Coke intake it could be that it was a reaction to some compound in the soft drink. Discussed given that he is asymptomatic at this time continuing to monitor. If they recur could consider treatment for trigeminal neuralgia versus referral to neurology. Given return precautions.  Marikay AlarEric Blima Jaimes, MD [facility] [place]

## 2016-07-02 NOTE — Patient Instructions (Signed)
Nice to see you. Please continue to monitor for recurrence of the headaches. If they recur please let us know. If you develop numbness, weakness, vision changes, sudden onset worst headache of her life, or any new or changing symptoms please seek medical attention.

## 2016-07-02 NOTE — Progress Notes (Signed)
Pre visit review using our clinic review tool, if applicable. No additional management support is needed unless otherwise documented below in the visit note. 

## 2016-08-06 ENCOUNTER — Other Ambulatory Visit: Payer: Self-pay | Admitting: Family Medicine

## 2016-08-06 DIAGNOSIS — E7800 Pure hypercholesterolemia, unspecified: Secondary | ICD-10-CM

## 2016-08-06 NOTE — Telephone Encounter (Signed)
Please advise on refill.

## 2016-09-24 ENCOUNTER — Other Ambulatory Visit: Payer: Self-pay | Admitting: Family Medicine

## 2016-09-24 DIAGNOSIS — E7800 Pure hypercholesterolemia, unspecified: Secondary | ICD-10-CM

## 2016-09-24 NOTE — Telephone Encounter (Signed)
Please advise 

## 2016-09-24 NOTE — Telephone Encounter (Signed)
Pt called and stated that he spoke with his [employer] called and stated that as of the first of the year they will no longer be covering VYTORIN 10-40 MG tablet. He would like to know that the alternative would be for this, he stated that he would like it to have the same ingredients as current medication. Please advise, thank you!  Call pt @ [phone]

## 2016-09-25 NOTE — Telephone Encounter (Signed)
The alternative is to prescribe the individual components of that medication. These would be simvastatin and Zetia. I can send these to his pharmacy if he would like.

## 2016-09-28 NOTE — Telephone Encounter (Signed)
LM for patient to return call.

## 2016-09-29 MED ORDER — EZETIMIBE-SIMVASTATIN 10-40 MG PO TABS: 1.0000 | ORAL_TABLET | Freq: Every day | ORAL | 0 refills | Status: DC

## 2016-09-29 MED ORDER — EZETIMIBE 10 MG PO TABS: 10.0000 mg | ORAL_TABLET | Freq: Every day | ORAL | 3 refills | Status: DC

## 2016-09-29 MED ORDER — SIMVASTATIN 40 MG PO TABS: 40.0000 mg | ORAL_TABLET | Freq: Every day | ORAL | 3 refills | Status: DC

## 2016-09-29 NOTE — Telephone Encounter (Signed)
Noted. Patient should start the Zetia and simvastatin once he runs out of the Vytorin. He should not take them at the same time. Thanks.

## 2016-09-29 NOTE — Telephone Encounter (Signed)
Patient is fine with doing the two medications. That will not start until January. He would like the Vytorin sent in as well for this month.

## 2016-10-28 ENCOUNTER — Other Ambulatory Visit: Payer: Self-pay | Admitting: Family Medicine

## 2016-10-28 DIAGNOSIS — E7800 Pure hypercholesterolemia, unspecified: Secondary | ICD-10-CM

## 2016-10-28 NOTE — Telephone Encounter (Signed)
Patient states the vytorin is not covered but he was told that there is a generic combo pill instead of taking the zetia and simvastatin separate. Informed patient that there is not a generic combo but patient states he will check with the pharmacist that informed him of this first

## 2016-10-28 NOTE — Telephone Encounter (Signed)
There appears to be a generic option in epic. We will attempt to send this in for the patient.

## 2016-10-28 NOTE — Telephone Encounter (Signed)
Please find out if he needs the vytorin or if he needs refills on the zetia and simvastatin.

## 2016-10-28 NOTE — Telephone Encounter (Signed)
Last filled 09/29/16 30 0rf

## 2016-10-28 NOTE — Telephone Encounter (Signed)
Left message to return call 

## 2016-11-09 IMAGING — CT CT HEAD W/O CM
3 series · 16 of 46 positions shown, 19 images · non-contrast
Comparison: None.

CLINICAL DATA: Headache for 3 months

EXAM:
CT HEAD WITHOUT CONTRAST
TECHNIQUE: Contiguous axial images were obtained from the base of the skull
through the vertex without intravenous contrast.

[Series 2: head wo · axial · 0.43mm/px · z∈[-51,+69]mm · 10 of 29 slices shown, 13 images]
[im 3/29  brain]
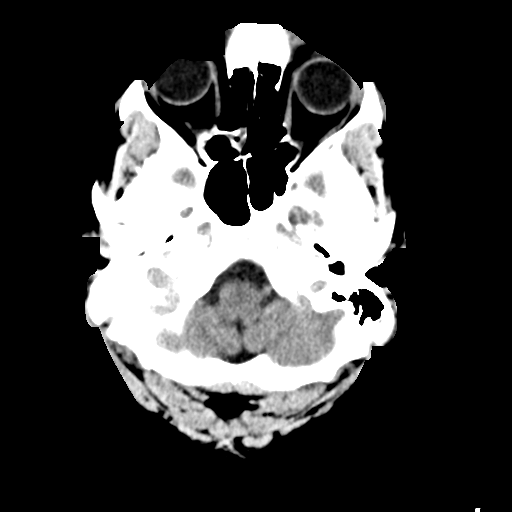
[im 3/29  bone]
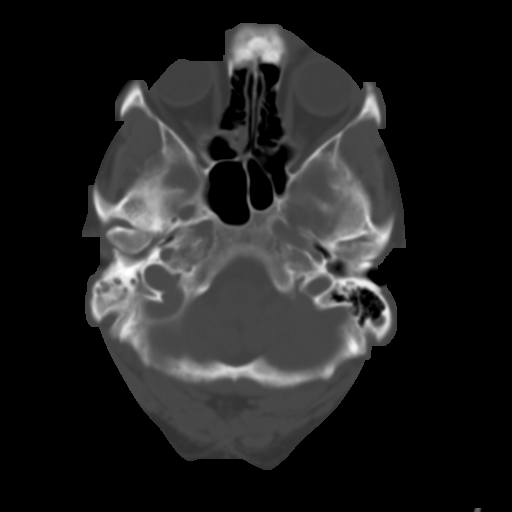
[im 6/29  brain]
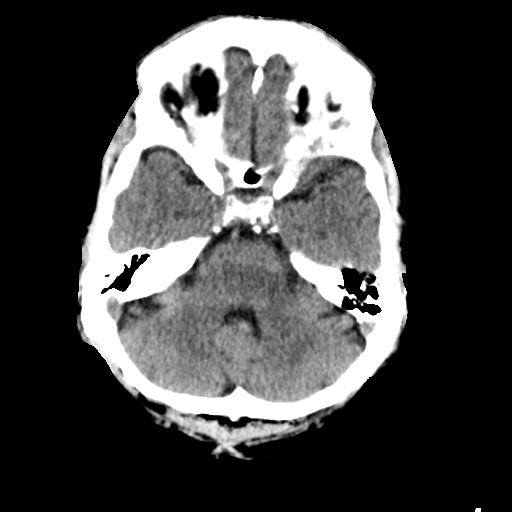
[im 8/29  brain]
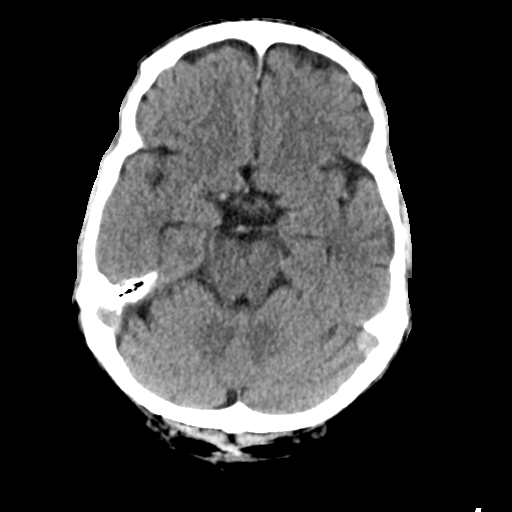
[im 11/29  brain]
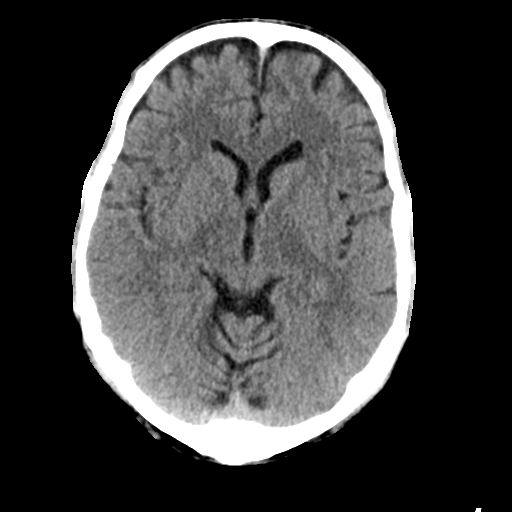
[im 14/29  brain]
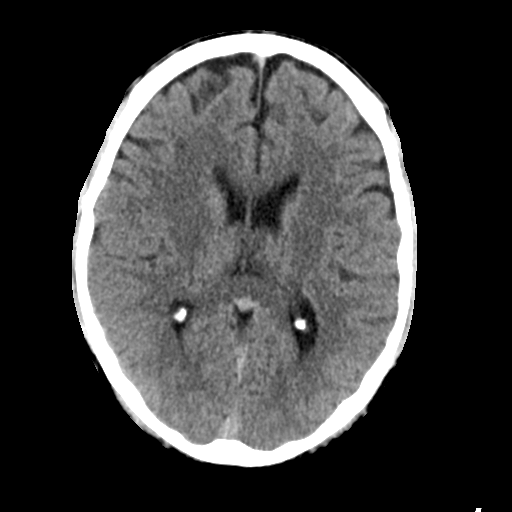
[im 14/29  bone]
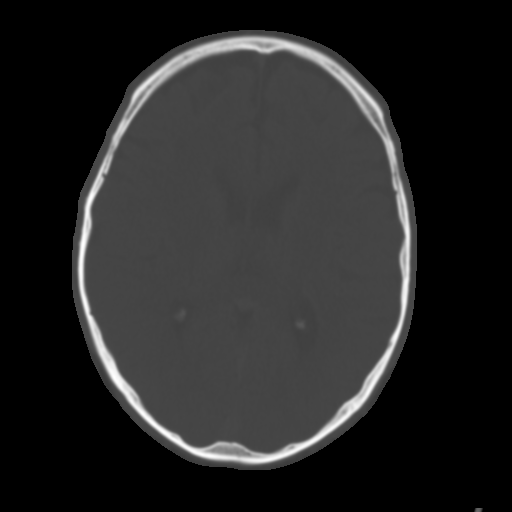
[im 16/29  brain]
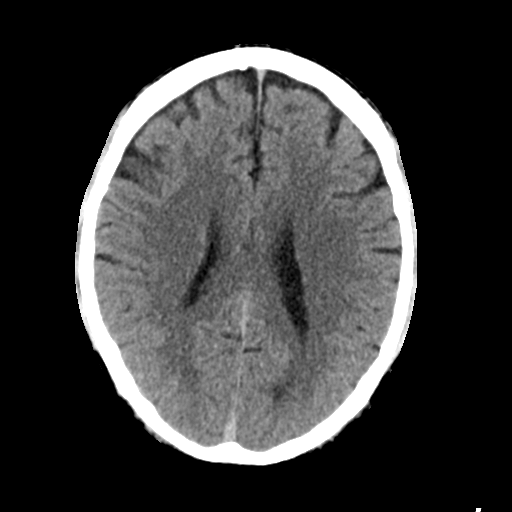
[im 19/29  brain]
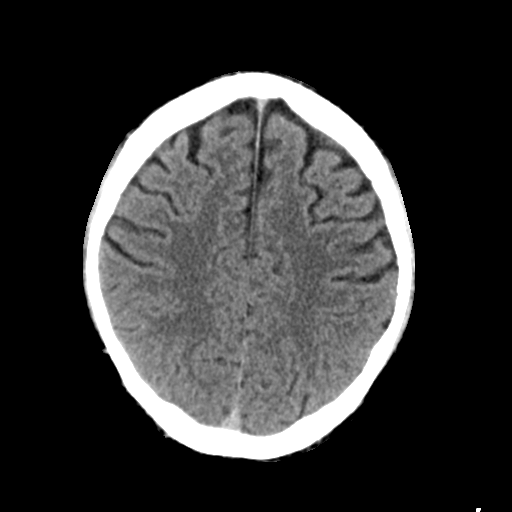
[im 22/29  brain]
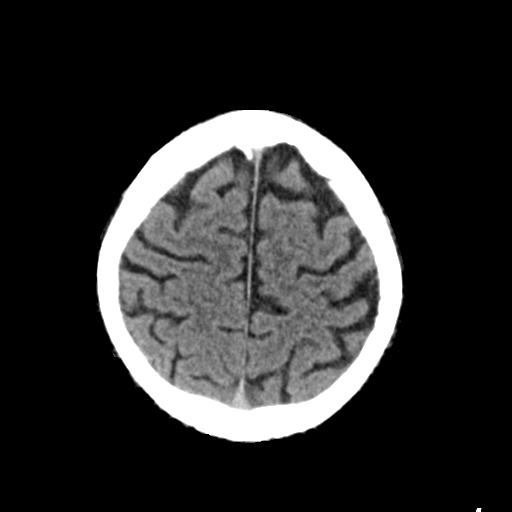
[im 24/29  brain]
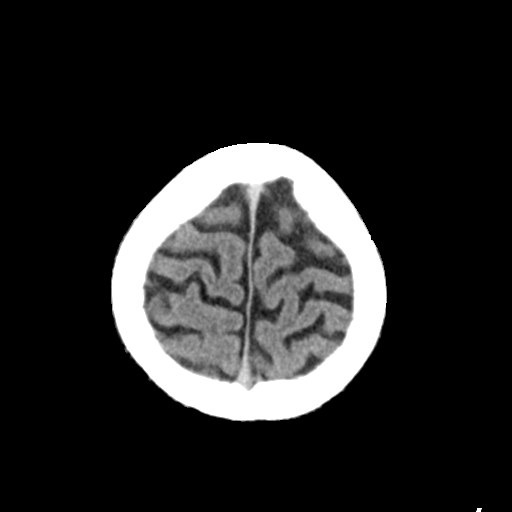
[im 24/29  bone]
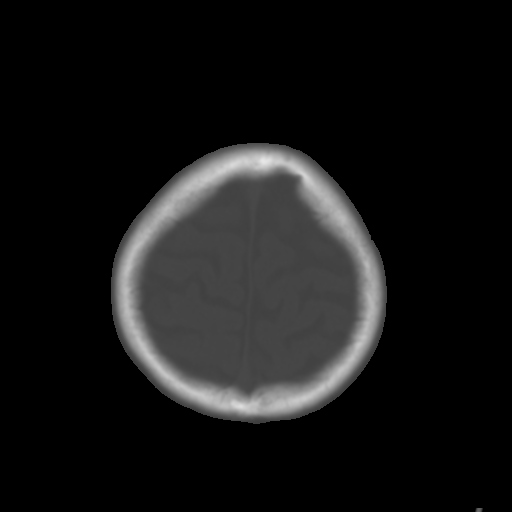
[im 27/29  brain]
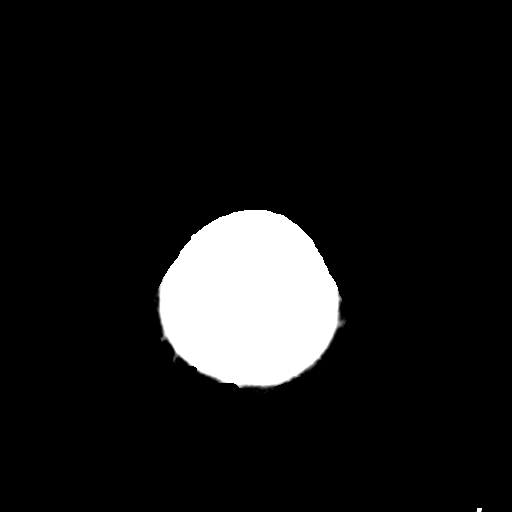

[Series 4: coronal soft tissue · coronal · 0.31mm/px · 3 of 65 slices shown]
[im 22/65  brain]
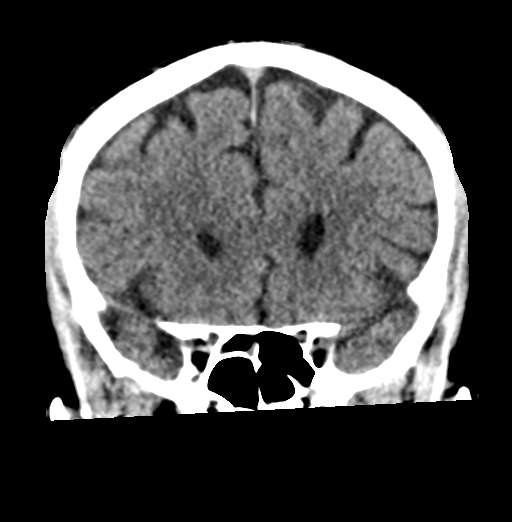
[im 29/65  brain]
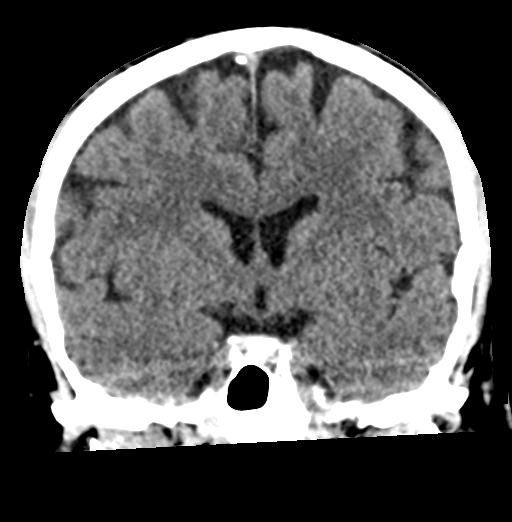
[im 36/65  brain]
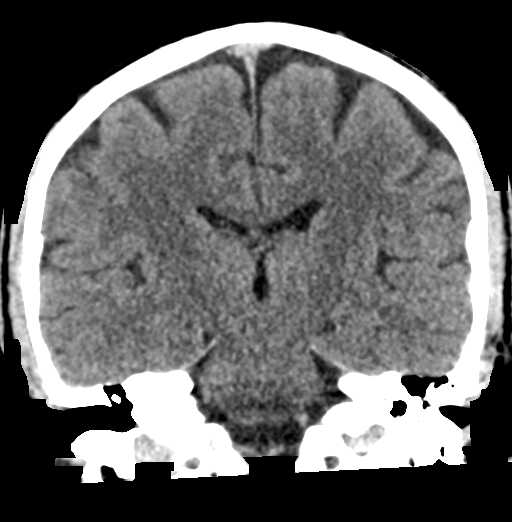

[Series 5: sagittal soft tissue · sagittal · 0.31mm/px · 3 of 53 slices shown]
[im 18/53  brain]
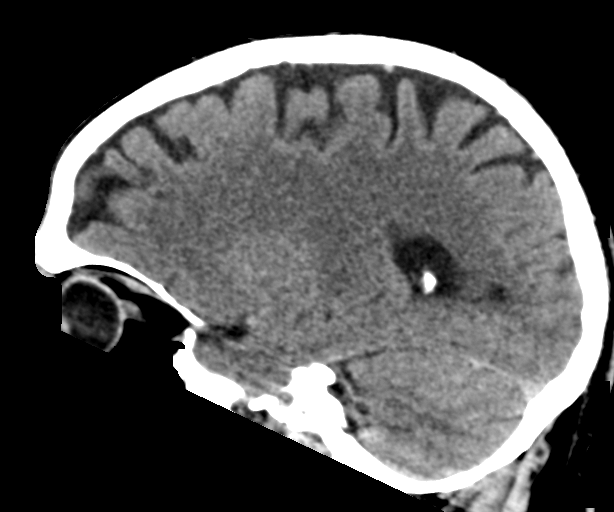
[im 27/53  brain]
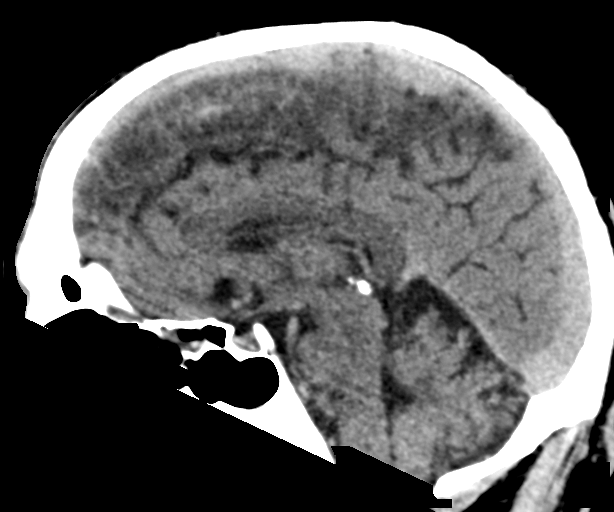
[im 35/53  brain]
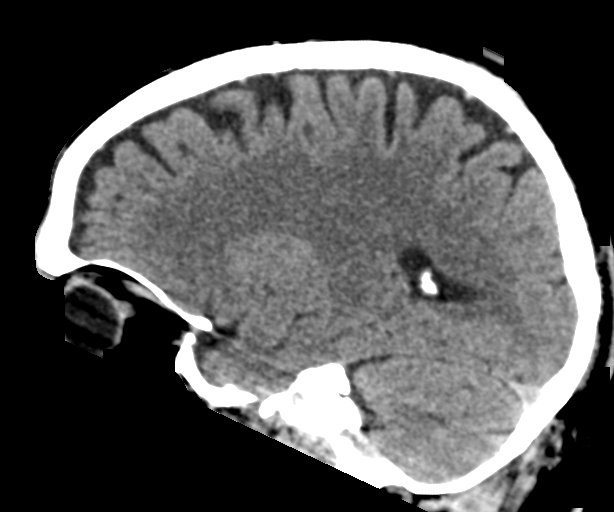

[16 of 46 positions shown; findings below may reference images not displayed]

FINDINGS: Brain: No evidence of acute infarction, hemorrhage, hydrocephalus,
extra-axial collection or mass lesion/mass effect. Mild generalized
cerebral atrophy.

Vascular: No hyperdense vessel or unexpected calcification.

Skull: Normal. Negative for fracture or focal lesion.

Sinuses/Orbits: No acute finding.

Other: None.
IMPRESSION: No acute intracranial pathology.

## 2016-12-10 ENCOUNTER — Encounter: Payer: Self-pay | Admitting: Family Medicine

## 2016-12-10 ENCOUNTER — Ambulatory Visit (INDEPENDENT_AMBULATORY_CARE_PROVIDER_SITE_OTHER): Payer: Commercial Managed Care - HMO | Admitting: Family Medicine

## 2016-12-10 VITALS — BP 110/64 | HR 82 | Temp 98.8°F | Resp 16 | Wt 195.2 lb

## 2016-12-10 DIAGNOSIS — E669 Obesity, unspecified: Secondary | ICD-10-CM | POA: Diagnosis not present

## 2016-12-10 DIAGNOSIS — J309 Allergic rhinitis, unspecified: Secondary | ICD-10-CM

## 2016-12-10 DIAGNOSIS — M79671 Pain in right foot: Secondary | ICD-10-CM | POA: Diagnosis not present

## 2016-12-10 DIAGNOSIS — E785 Hyperlipidemia, unspecified: Secondary | ICD-10-CM

## 2016-12-10 DIAGNOSIS — M79673 Pain in unspecified foot: Secondary | ICD-10-CM | POA: Insufficient documentation

## 2016-12-10 NOTE — Assessment & Plan Note (Signed)
Suspect congestion and postnasal drip related to allergic rhinitis. Discussed Flonase or nasal saline. Continuing to monitor.

## 2016-12-10 NOTE — Progress Notes (Signed)
Pre-visit discussion using our clinic review tool. No additional management support is needed unless otherwise documented below in the visit note.  

## 2016-12-10 NOTE — Assessment & Plan Note (Signed)
He notes his clothes are fitting better. Weight is down 5 pounds. Encouraged to continue diet and exercise.

## 2016-12-10 NOTE — Patient Instructions (Signed)
Nice to see you. Please continue to work on diet and exercise. We will have you return for fasting lab work and next 1-2 weeks at her convenience. Please monitor the right foot and if this worsens please let us know. You can try nasal saline or Flonase for your congestion.

## 2016-12-10 NOTE — Assessment & Plan Note (Signed)
He will return for fasting lab work. Continue current medications.

## 2016-12-10 NOTE — Progress Notes (Signed)
  Tommi Rumps, MD Phone: [phone]  Erik Weaver is a 56 y.o. male who presents today for f/u.  HYPERLIPIDEMIA Symptoms Chest pain on exertion:  no   Leg claudication:   no Medications: Compliance- taking vytorin Right upper quadrant pain- no  Muscle aches- no  Obesity: He notes he has stepped up his game with exercise. He is watching what he eats as well. He works out 4 days a week. He typically eats oatmeal and blueberries and protein for breakfast. Egg whites as a snack. Vegetables at lunch. Proteins at night. Does note he is somewhat stressed at work and this does change his eating habits at times.  Patient notes over the last 5 years he has noted some slight increase in nasal congestion and postnasal drip. Reports he was exposed to concrete dust and asphalt dust in the past and wonders if this is related. No chest congestion. No cough.  Right lateral foot pain: Patient notes he noticed this gradually after doing box jumps and leg presses. It was a burning sensation on the lateral aspect of his foot. It was fairly inflamed previously though not very bad at this time. No pain today.   PMH: Former smoker   ROS see history of present illness  Objective  Physical Exam Vitals:   12/10/16 0809  BP: 110/64  Pulse: 82  Resp: 16  Temp: 98.8 F (37.1 C)    BP Readings from Last 3 Encounters:  12/10/16 110/64  07/02/16 118/72  06/24/16 130/80   Wt Readings from Last 3 Encounters:  12/10/16 195 lb 3.2 oz (88.5 kg)  07/02/16 200 lb 12.8 oz (91.1 kg)  06/24/16 195 lb (88.5 kg)    Physical Exam  Constitutional: No distress.  Cardiovascular: Normal rate, regular rhythm and normal heart sounds.   Pulmonary/Chest: Effort normal and breath sounds normal.  Musculoskeletal:  Bilateral feet with no swelling, warmth, or erythema, no tenderness on the lateral aspect and plantar aspect of feet, 2+ DP pulses bilaterally  Neurological: He is alert. Gait normal.  Skin: Skin is  warm and dry. He is not diaphoretic.     Assessment/Plan: Please see individual problem list.  Hyperlipidemia He will return for fasting lab work. Continue current medications.  Obesity (BMI 30.0-34.9) He notes his clothes are fitting better. Weight is down 5 pounds. Encouraged to continue diet and exercise.  Allergic rhinitis Suspect congestion and postnasal drip related to allergic rhinitis. Discussed Flonase or nasal saline. Continuing to monitor.  Foot pain Somewhat nonspecific foot pain. No pain today. No specific injury. Could be plantar fasciitis or soft tissue injury. Discussed icing and anti-inflammatories. He'll continue to monitor.   Orders Placed This Encounter  Procedures  . Lipid Profile    Standing Status:   Future    Standing Expiration Date:   12/10/2017  . Comp Met (CMET)    Standing Status:   Future    Standing Expiration Date:   12/10/2017    Tommi Rumps, MD [place]

## 2016-12-10 NOTE — Assessment & Plan Note (Signed)
Somewhat nonspecific foot pain. No pain today. No specific injury. Could be plantar fasciitis or soft tissue injury. Discussed icing and anti-inflammatories. He'll continue to monitor.

## 2017-03-04 ENCOUNTER — Other Ambulatory Visit (INDEPENDENT_AMBULATORY_CARE_PROVIDER_SITE_OTHER): Payer: Commercial Managed Care - HMO

## 2017-03-04 DIAGNOSIS — E785 Hyperlipidemia, unspecified: Secondary | ICD-10-CM | POA: Diagnosis not present

## 2017-03-04 NOTE — Addendum Note (Signed)
Addended by: Penne LashWIGGINS, Tacey Dimaggio N on: 03/04/2017 08:34 AM   Modules accepted: Orders

## 2017-03-05 LAB — COMPREHENSIVE METABOLIC PANEL
ALK PHOS: 73 IU/L (ref 39–117)
ALT: 26 IU/L (ref 0–44)
AST: 22 IU/L (ref 0–40)
Albumin/Globulin Ratio: 1.8 (ref 1.2–2.2)
Albumin: 4.3 g/dL (ref 3.5–5.5)
BUN / CREAT RATIO: 18 (ref 9–20)
BUN: 20 mg/dL (ref 6–24)
Bilirubin Total: 0.9 mg/dL (ref 0.0–1.2)
CALCIUM: 9.5 mg/dL (ref 8.7–10.2)
CO2: 24 mmol/L (ref 18–29)
CREATININE: 1.14 mg/dL (ref 0.76–1.27)
Chloride: 104 mmol/L (ref 96–106)
GFR calc Af Amer: 84 mL/min/{1.73_m2} (ref >59)
GFR calc non Af Amer: 73 mL/min/{1.73_m2} (ref >59)
GLUCOSE: 98 mg/dL (ref 65–99)
Globulin, Total: 2.4 g/dL (ref 1.5–4.5)
Potassium: 4.3 mmol/L (ref 3.5–5.2)
Sodium: 141 mmol/L (ref 134–144)
Total Protein: 6.7 g/dL (ref 6.0–8.5)

## 2017-03-05 LAB — LIPID PANEL
CHOLESTEROL TOTAL: 141 mg/dL (ref 100–199)
Chol/HDL Ratio: 2.5 ratio (ref 0.0–5.0)
HDL: 57 mg/dL (ref >39)
LDL CALC: 70 mg/dL (ref 0–99)
TRIGLYCERIDES: 68 mg/dL (ref 0–149)
VLDL CHOLESTEROL CAL: 14 mg/dL (ref 5–40)

## 2017-03-11 ENCOUNTER — Ambulatory Visit (INDEPENDENT_AMBULATORY_CARE_PROVIDER_SITE_OTHER): Payer: Commercial Managed Care - HMO | Admitting: Family Medicine

## 2017-03-11 ENCOUNTER — Encounter: Payer: Self-pay | Admitting: Family Medicine

## 2017-03-11 DIAGNOSIS — Z0000 Encounter for general adult medical examination without abnormal findings: Secondary | ICD-10-CM

## 2017-03-11 NOTE — Progress Notes (Signed)
Erik AlarEric Laurielle Selmon, MD Phone: [phone]  Erik DockerRandall K Weaver is a 56 y.o. male who presents today for physical exam.  Diet: Not eating quite as he should. Lost his motivation to work on his diet at times. Does try to eat blueberries and a protein shake and oatmeal for breakfast some days. Does occasionally eat McDonald's for breakfast. Exercises some. Not anything consistent currently. He's got more responsibilities at work and recently separated from his significant other. He thinks these things may be playing a role in his lack of motivation. Up-to-date on tetanus vaccination. Up-to-date on hepatitis C and HIV testing. Up-to-date on colonoscopy. Does have a family history of colon cancer and will be due for colonoscopy next year. Prostate cancer screening last year. Discussion with patient today resulted in deferring testing this year. We will plan on testing next year. No tobacco use. Social alcohol use. No illicit drug use.  Active Ambulatory Problems    Diagnosis Date Noted  . 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 Hyperlipidemia 03/07/2007  . 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 Obesity (BMI 30.0-34.9) 10/31/2015  . Routine general medical examination at a health care facility 03/05/2016  . Headache 07/02/2016  . Allergic rhinitis 12/10/2016  . Foot pain 12/10/2016   Resolved Ambulatory Problems    Diagnosis Date Noted  . Establishing care with new doctor, encounter for 10/31/2015   Past Medical History:  Diagnosis Date  . Chickenpox   . Hyperlipidemia   . Migraines     Family History  Problem Relation Age of Onset  . Alcoholism Unknown        Parent, other relative  . Arthritis Unknown        Parent, Other relative   . Colon cancer Father   . Heart disease Father   . Lung cancer Unknown        Grandparent  . Hyperlipidemia Unknown        Parent, other relative  . Hypertension Unknown        Parent, grandparent, other relative  . Diabetes Unknown        Parent, grandparent     Social  History   Social History  . Marital status: Single    Spouse name: N/A  . Number of children: N/A  . Years of education: N/A   Occupational History  . Not on file.   Social History Main Topics  . Smoking status: Former Games developermoker  . Smokeless tobacco: Former NeurosurgeonUser    Types: Chew  . Alcohol use 0.0 oz/week     Comment: Infrequent  . Drug use: No  . Sexual activity: Not on file   Other Topics Concern  . Not on file   Social History Narrative  . No narrative on file    ROS  General:  Negative for nexplained weight loss, fever Skin: Negative for new or changing mole, sore that won't heal HEENT: Negative for trouble hearing, trouble seeing, ringing in ears, mouth sores, hoarseness, change in voice, dysphagia. CV:  Negative for chest pain, dyspnea, edema, palpitations Resp: Negative for cough, dyspnea, hemoptysis GI: Negative for nausea, vomiting, diarrhea, constipation, abdominal pain, melena, hematochezia. GU: Negative for dysuria, incontinence, urinary hesitance, hematuria, vaginal or penile discharge, polyuria, sexual difficulty, lumps in testicle or breasts MSK: Negative for muscle cramps or aches, joint pain or swelling Neuro: Negative for headaches, weakness, numbness, dizziness, passing out/fainting Psych: Negative for depression, anxiety, memory problems  Objective  Physical Exam Vitals:   03/11/17 0800  BP: 114/82  Pulse: 60  Temp: 98.7  F (37.1 C)    BP Readings from Last 3 Encounters:  03/11/17 114/82  12/10/16 110/64  07/02/16 118/72   Wt Readings from Last 3 Encounters:  03/11/17 199 lb 9.6 oz (90.5 kg)  12/10/16 195 lb 3.2 oz (88.5 kg)  07/02/16 200 lb 12.8 oz (91.1 kg)    Physical Exam  Constitutional: No distress.  HENT:  Head: Normocephalic and atraumatic.  Mouth/Throat: Oropharynx is clear and moist. No oropharyngeal exudate.  Eyes: Conjunctivae are normal. Pupils are equal, round, and reactive to light.  Neck: Neck supple.  Cardiovascular:  Normal rate, regular rhythm and normal heart sounds.   Pulmonary/Chest: Effort normal and breath sounds normal.  Abdominal: Soft. Bowel sounds are normal. He exhibits no distension. There is no tenderness. There is no rebound and no guarding.  Musculoskeletal: He exhibits no edema.  Lymphadenopathy:    He has no cervical adenopathy.  Neurological: He is alert. Gait normal.  Skin: Skin is warm and dry. He is not diaphoretic.  Psychiatric: Mood and affect normal.     Assessment/Plan:   Routine general medical examination at a health care facility Physical exam completed. Overall doing well. He is obese. Discussed diet and exercise changes. Blood pressure well controlled. Recent cholesterol check well-controlled. Discussed prostate cancer screening though he deferred to next year. Tetanus vaccination up-to-date. Hepatitis C and HIV testing done last year. Colonoscopy up-to-date. He'll be due next year. He'll let us know if he would like to see a nutritionist for his diet.  Erik Alar, MD [facility] [place] [facility]

## 2017-03-11 NOTE — Assessment & Plan Note (Signed)
Physical exam completed. Overall doing well. He is obese. Discussed diet and exercise changes. Blood pressure well controlled. Recent cholesterol check well-controlled. Discussed prostate cancer screening though he deferred to next year. Tetanus vaccination up-to-date. Hepatitis C and HIV testing done last year. Colonoscopy up-to-date. He'll be due next year. He'll let us know if he would like to see a nutritionist for his diet.

## 2017-03-11 NOTE — Patient Instructions (Signed)
Nice to see you. Please start working on diet and exercise as discussed. If you would like to see a nutritionist please let us know. You will be due for colonoscopy next year.

## 2017-09-16 ENCOUNTER — Other Ambulatory Visit: Payer: Self-pay

## 2017-09-16 ENCOUNTER — Encounter: Payer: Self-pay | Admitting: Family Medicine

## 2017-09-16 ENCOUNTER — Ambulatory Visit: Payer: 59 | Admitting: Family Medicine

## 2017-09-16 VITALS — BP 116/84 | HR 70 | Temp 98.4°F | Wt 207.4 lb

## 2017-09-16 DIAGNOSIS — Z23 Encounter for immunization: Secondary | ICD-10-CM | POA: Diagnosis not present

## 2017-09-16 DIAGNOSIS — E669 Obesity, unspecified: Secondary | ICD-10-CM | POA: Diagnosis not present

## 2017-09-16 DIAGNOSIS — E785 Hyperlipidemia, unspecified: Secondary | ICD-10-CM | POA: Diagnosis not present

## 2017-09-16 DIAGNOSIS — L719 Rosacea, unspecified: Secondary | ICD-10-CM | POA: Diagnosis not present

## 2017-09-16 NOTE — Assessment & Plan Note (Signed)
Seems to be fairly well controlled.  He will monitor.

## 2017-09-16 NOTE — Progress Notes (Signed)
  Erik AlarEric Tunisia Landgrebe, MD Phone: [phone]  Erik Weaver is a 56 y.o. male who presents today for follow-up.  Hyperlipidemia: Taking Vytorin.  No chest pain or shortness of breath.  No myalgias.  No right upper quadrant pain.  Obesity: Patient is exercising about 3 times a week.  Doing some low intensity cardio.  Lifting weights.  His dietary habits have become more poor.  He does eat a healthy breakfast though lunch sounds to be an issue.  He has been eating a little more sweets as well.  Has quantity issues.  Rosacea: History of this.  No medications.  Does use coconut oil on his face.  Social History   Tobacco Use  Smoking Status Former Smoker  Smokeless Tobacco Former NeurosurgeonUser  . Types: Chew     ROS see history of present illness  Objective  Physical Exam Vitals:   09/16/17 0846  BP: 116/84  Pulse: 70  Temp: 98.4 F (36.9 C)  SpO2: 96%    BP Readings from Last 3 Encounters:  09/16/17 116/84  03/11/17 114/82  12/10/16 110/64   Wt Readings from Last 3 Encounters:  09/16/17 207 lb 6.4 oz (94.1 kg)  03/11/17 199 lb 9.6 oz (90.5 kg)  12/10/16 195 lb 3.2 oz (88.5 kg)    Physical Exam  Constitutional: No distress.  Cardiovascular: Normal rate, regular rhythm and normal heart sounds.  Pulmonary/Chest: Effort normal and breath sounds normal.  Neurological: He is alert. Gait normal.  Skin: He is not diaphoretic.  Minimal telangiectasias on bilateral cheeks     Assessment/Plan: Please see individual problem list.  ROSACEA Seems to be fairly well controlled.  He will monitor.  Hyperlipidemia Cholesterol well controlled.  Continue Vytorin.  Obesity (BMI 30.0-34.9) Weight is actually up from prior.  Discussed diet and exercise at length with patient.  Given diet information.   Erik GreathouseRandall was seen today for follow-up.  Diagnoses and all orders for this visit:  Rosacea  Need for immunization against influenza -     Flu Vaccine QUAD 36+ mos IM  Hyperlipidemia,  unspecified hyperlipidemia type  Obesity (BMI 30.0-34.9)    Orders Placed This Encounter  Procedures  . Flu Vaccine QUAD 36+ mos IM    No orders of the defined types were placed in this encounter.    Erik AlarEric Augie Vane, MD [facility] [place]

## 2017-09-16 NOTE — Assessment & Plan Note (Signed)
Cholesterol well controlled.  Continue Vytorin.

## 2017-09-16 NOTE — Assessment & Plan Note (Signed)
Weight is actually up from prior.  Discussed diet and exercise at length with patient.  Given diet information.

## 2017-09-16 NOTE — Patient Instructions (Signed)
Nice to see you. Please work on diet and exercise as we discussed. Please continue your cholesterol medication.  Diet Recommendations  Starchy (carb) foods: Bread, rice, pasta, potatoes, corn, cereal, grits, crackers, bagels, muffins, all baked goods.  (Fruits, milk, and yogurt also have carbohydrate, but most of these foods will not spike your blood sugar as the starchy foods will.)  A few fruits do cause high blood sugars; use small portions of bananas (limit to 1/2 at a time), grapes, watermelon, oranges, and most tropical fruits.    Protein foods: Meat, fish, poultry, eggs, dairy foods, and beans such as pinto and kidney beans (beans also provide carbohydrate).   1. Eat at least 3 meals and 1-2 snacks per day. Never go more than 4-5 hours while awake without eating. Eat breakfast within the first hour of getting up.   2. Limit starchy foods to TWO per meal and ONE per snack. ONE portion of a starchy  food is equal to the following:   - ONE slice of bread (or its equivalent, such as half of a hamburger bun).   - 1/2 cup of a "scoopable" starchy food such as potatoes or rice.   - 15 grams of carbohydrate as shown on food label.  3. Include at every meal: a protein food, a carb food, and vegetables and/or fruit.   - Obtain twice the volume of veg's as protein or carbohydrate foods for both lunch and dinner.   - Fresh or frozen veg's are best.   - Keep frozen veg's on hand for a quick vegetable serving.

## 2017-10-27 ENCOUNTER — Other Ambulatory Visit: Payer: Self-pay | Admitting: Family Medicine

## 2017-10-27 DIAGNOSIS — E7800 Pure hypercholesterolemia, unspecified: Secondary | ICD-10-CM

## 2018-03-17 ENCOUNTER — Encounter: Payer: Self-pay | Admitting: Family Medicine

## 2018-03-17 ENCOUNTER — Other Ambulatory Visit: Payer: Self-pay

## 2018-03-17 ENCOUNTER — Ambulatory Visit (INDEPENDENT_AMBULATORY_CARE_PROVIDER_SITE_OTHER): Payer: 59 | Admitting: Family Medicine

## 2018-03-17 VITALS — BP 128/84 | HR 62 | Temp 97.6°F | Resp 14 | Ht 66.0 in | Wt 214.0 lb

## 2018-03-17 DIAGNOSIS — Z0001 Encounter for general adult medical examination with abnormal findings: Secondary | ICD-10-CM

## 2018-03-17 DIAGNOSIS — E6609 Other obesity due to excess calories: Secondary | ICD-10-CM

## 2018-03-17 DIAGNOSIS — Z6834 Body mass index (BMI) 34.0-34.9, adult: Secondary | ICD-10-CM | POA: Diagnosis not present

## 2018-03-17 DIAGNOSIS — Z125 Encounter for screening for malignant neoplasm of prostate: Secondary | ICD-10-CM | POA: Diagnosis not present

## 2018-03-17 DIAGNOSIS — H539 Unspecified visual disturbance: Secondary | ICD-10-CM | POA: Diagnosis not present

## 2018-03-17 DIAGNOSIS — Z800 Family history of malignant neoplasm of digestive organs: Secondary | ICD-10-CM | POA: Diagnosis not present

## 2018-03-17 DIAGNOSIS — E785 Hyperlipidemia, unspecified: Secondary | ICD-10-CM

## 2018-03-17 DIAGNOSIS — E669 Obesity, unspecified: Secondary | ICD-10-CM | POA: Insufficient documentation

## 2018-03-17 DIAGNOSIS — Z1211 Encounter for screening for malignant neoplasm of colon: Secondary | ICD-10-CM

## 2018-03-17 NOTE — Assessment & Plan Note (Signed)
Refer to GI for colonoscopy.

## 2018-03-17 NOTE — Progress Notes (Signed)
Tommi Rumps, MD Phone: [phone]  Erik Weaver is a 57 y.o. male who presents today for cpe.  Keeps up with exercise multiple days a week. He started an over-the-counter testosterone booster and has been taking an estrogen blocker.  He notes his appetite has increased significantly since doing this.  He is not eating very well. Colonoscopy is due this July.  We will request records and refer to GI. Tetanus vaccination is up-to-date. Prostate cancer screening to be done today with PSA. Hepatitis C and HIV testing up-to-date. No tobacco use or illicit drug use.  Rare alcohol use. He sees a Pharmacist, community twice a year.  Sees an ophthalmologist once a year.  He does note some up close vision issues that have been chronic and unchanged.  Active Ambulatory Problems    Diagnosis Date Noted  . 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 Hyperlipidemia 03/07/2007  . 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 Encounter for general adult medical examination with abnormal findings 03/05/2016  . Headache 07/02/2016  . Allergic rhinitis 12/10/2016  . Family history of colon cancer in father 03/17/2018  . Vision changes 03/17/2018  . Class 1 obesity due to excess calories with serious comorbidity and body mass index (BMI) of 34.0 to 34.9 in adult 03/17/2018   Resolved Ambulatory Problems    Diagnosis Date Noted  . Establishing care with new doctor, encounter for 10/31/2015  . Obesity (BMI 30.0-34.9) 10/31/2015  . Foot pain 12/10/2016   Past Medical History:  Diagnosis Date  . Chickenpox   . Hyperlipidemia   . Migraines     Family History  Problem Relation Age of Onset  . Alcoholism Unknown        Parent, other relative  . Arthritis Unknown        Parent, Other relative   . Colon cancer Father   . Heart disease Father   . Lung cancer Unknown        Grandparent  . Hyperlipidemia Unknown        Parent, other relative  . Hypertension Unknown        Parent, grandparent, other relative  . Diabetes Unknown    Parent, grandparent     Social History   Socioeconomic History  . Marital status: Single    Spouse name: Not on file  . Number of children: Not on file  . Years of education: Not on file  . Highest education level: Not on file  Occupational History  . Not on file  Social Needs  . Financial resource strain: Not on file  . Food insecurity:    Worry: Not on file    Inability: Not on file  . Transportation needs:    Medical: Not on file    Non-medical: Not on file  Tobacco Use  . Smoking status: Former Research scientist (life sciences)  . Smokeless tobacco: Former Systems developer    Types: Chew  Substance and Sexual Activity  . Alcohol use: Yes    Alcohol/week: 0.0 oz    Comment: Infrequent  . Drug use: No  . Sexual activity: Not on file  Lifestyle  . Physical activity:    Days per week: Not on file    Minutes per session: Not on file  . Stress: Not on file  Relationships  . Social connections:    Talks on phone: Not on file    Gets together: Not on file    Attends religious service: Not on file    Active member of club or organization: Not on file  Attends meetings of clubs or organizations: Not on file    Relationship status: Not on file  . Intimate partner violence:    Fear of current or ex partner: Not on file    Emotionally abused: Not on file    Physically abused: Not on file    Forced sexual activity: Not on file  Other Topics Concern  . Not on file  Social History Narrative  . Not on file    ROS  General:  Negative for nexplained weight loss, fever Skin: Negative for new or changing mole, sore that won't heal HEENT: Refer trouble seeing, negative for trouble hearing, ringing in ears, mouth sores, hoarseness, change in voice, dysphagia. CV:  Negative for chest pain, dyspnea, edema, palpitations Resp: Negative for cough, dyspnea, hemoptysis GI: Negative for nausea, vomiting, diarrhea, constipation, abdominal pain, melena, hematochezia. GU: Negative for dysuria, incontinence, urinary  hesitance, hematuria, vaginal or penile discharge, polyuria, sexual difficulty, lumps in testicle or breasts MSK: Negative for muscle cramps or aches, joint pain or swelling Neuro: Negative for headaches, weakness, numbness, dizziness, passing out/fainting Psych: Negative for depression, anxiety, memory problems  Objective  Physical Exam Vitals:   03/17/18 0814  BP: 128/84  Pulse: 62  Resp: 14  Temp: 97.6 F (36.4 C)  SpO2: 97%    BP Readings from Last 3 Encounters:  03/17/18 128/84  09/16/17 116/84  03/11/17 114/82   Wt Readings from Last 3 Encounters:  03/17/18 214 lb (97.1 kg)  09/16/17 207 lb 6.4 oz (94.1 kg)  03/11/17 199 lb 9.6 oz (90.5 kg)    Physical Exam  Constitutional: No distress.  HENT:  Head: Normocephalic and atraumatic.  Mouth/Throat: Oropharynx is clear and moist. No oropharyngeal exudate.  Eyes: Pupils are equal, round, and reactive to light. Conjunctivae are normal.  Neck: Neck supple.  Cardiovascular: Normal rate, regular rhythm and normal heart sounds.  Pulmonary/Chest: Effort normal and breath sounds normal.  Abdominal: Soft. Bowel sounds are normal. He exhibits no distension. There is no tenderness.  Musculoskeletal: He exhibits no edema.  Lymphadenopathy:    He has no cervical adenopathy.  Neurological: He is alert.  Skin: Skin is warm and dry. He is not diaphoretic.  Psychiatric: He has a normal mood and affect.     Assessment/Plan:   Encounter for general adult medical examination with abnormal findings Physical exam completed.  He will stop the over-the-counter supplementation and see if this helps with his appetite returning to normal.  He will monitor his diet.  We will refer to GI for colonoscopy given family history of colon cancer.  Lab work as outlined below.  Family history of colon cancer in father Refer to GI for colonoscopy.  Vision changes Patient notes near vision issues.  I encouraged him to see his ophthalmologist this  year.   Orders Placed This Encounter  Procedures  . Lipid panel  . Comp Met (CMET)  . HgB A1c  . PSA  . Ambulatory referral to Gastroenterology    Referral Priority:   Routine    Referral Type:   Consultation    Referral Reason:   Specialty Services Required    Number of Visits Requested:   1    No orders of the defined types were placed in this encounter.    Tommi Rumps, MD [place]

## 2018-03-17 NOTE — Assessment & Plan Note (Signed)
Physical exam completed.  He will stop the over-the-counter supplementation and see if this helps with his appetite returning to normal.  He will monitor his diet.  We will refer to GI for colonoscopy given family history of colon cancer.  Lab work as outlined below.

## 2018-03-17 NOTE — Assessment & Plan Note (Signed)
Patient notes near vision issues.  I encouraged him to see his ophthalmologist this year.

## 2018-03-17 NOTE — Patient Instructions (Signed)
Nice to see you. Please stop the over-the-counter supplement you have been taking.  Please alter your diet as well. We will get you to see GI for colonoscopy. We will get lab work today.

## 2018-03-18 LAB — COMPREHENSIVE METABOLIC PANEL
A/G RATIO: 1.8 (ref 1.2–2.2)
ALK PHOS: 83 IU/L (ref 39–117)
ALT: 39 IU/L (ref 0–44)
AST: 26 IU/L (ref 0–40)
Albumin: 4.4 g/dL (ref 3.5–5.5)
BUN/Creatinine Ratio: 15 (ref 9–20)
BUN: 16 mg/dL (ref 6–24)
Bilirubin Total: 0.8 mg/dL (ref 0.0–1.2)
CALCIUM: 9.4 mg/dL (ref 8.7–10.2)
CHLORIDE: 105 mmol/L (ref 96–106)
CO2: 22 mmol/L (ref 20–29)
Creatinine, Ser: 1.06 mg/dL (ref 0.76–1.27)
GFR calc Af Amer: 91 mL/min/{1.73_m2} (ref >59)
GFR calc non Af Amer: 79 mL/min/{1.73_m2} (ref >59)
Globulin, Total: 2.4 g/dL (ref 1.5–4.5)
Glucose: 93 mg/dL (ref 65–99)
POTASSIUM: 4.8 mmol/L (ref 3.5–5.2)
Sodium: 142 mmol/L (ref 134–144)
Total Protein: 6.8 g/dL (ref 6.0–8.5)

## 2018-03-18 LAB — LIPID PANEL
CHOLESTEROL TOTAL: 160 mg/dL (ref 100–199)
Chol/HDL Ratio: 3.3 ratio (ref 0.0–5.0)
HDL: 48 mg/dL (ref >39)
LDL Calculated: 82 mg/dL (ref 0–99)
Triglycerides: 148 mg/dL (ref 0–149)
VLDL Cholesterol Cal: 30 mg/dL (ref 5–40)

## 2018-03-18 LAB — PSA: Prostate Specific Ag, Serum: 1.1 ng/mL (ref 0.0–4.0)

## 2018-03-18 LAB — HEMOGLOBIN A1C
Est. average glucose Bld gHb Est-mCnc: 97 mg/dL
HEMOGLOBIN A1C: 5 % (ref 4.8–5.6)

## 2018-05-01 ENCOUNTER — Ambulatory Visit: Payer: 59 | Admitting: Anesthesiology

## 2018-05-01 ENCOUNTER — Encounter: Payer: Self-pay | Admitting: *Deleted

## 2018-05-01 ENCOUNTER — Ambulatory Visit
Admission: RE | Admit: 2018-05-01 | Discharge: 2018-05-01 | Disposition: A | Discharge status: Home / Self Care | Payer: 59 | Source: Ambulatory Visit | Attending: Gastroenterology | Admitting: Gastroenterology

## 2018-05-01 ENCOUNTER — Encounter
Admission: RE | Disposition: A | Discharge status: Home / Self Care | Payer: Self-pay | Source: Ambulatory Visit | Attending: Gastroenterology

## 2018-05-01 DIAGNOSIS — Z1211 Encounter for screening for malignant neoplasm of colon: Secondary | ICD-10-CM | POA: Diagnosis not present

## 2018-05-01 DIAGNOSIS — E785 Hyperlipidemia, unspecified: Secondary | ICD-10-CM | POA: Insufficient documentation

## 2018-05-01 DIAGNOSIS — Z87891 Personal history of nicotine dependence: Secondary | ICD-10-CM | POA: Diagnosis not present

## 2018-05-01 DIAGNOSIS — Z800 Family history of malignant neoplasm of digestive organs: Secondary | ICD-10-CM | POA: Diagnosis not present

## 2018-05-01 DIAGNOSIS — Z79899 Other long term (current) drug therapy: Secondary | ICD-10-CM | POA: Diagnosis not present

## 2018-05-01 HISTORY — PX: COLONOSCOPY WITH PROPOFOL: SHX5780

## 2018-05-01 SURGERY — COLONOSCOPY WITH PROPOFOL
Anesthesia: General

## 2018-05-01 MED ORDER — PROPOFOL 500 MG/50ML IV EMUL
INTRAVENOUS | Status: DC | PRN
  Administered 2018-05-01: 120 ug/kg/min via INTRAVENOUS

## 2018-05-01 MED ORDER — SODIUM CHLORIDE 0.9 % IV SOLN
INTRAVENOUS | Status: DC
  Administered 2018-05-01: 08:00:00 via INTRAVENOUS

## 2018-05-01 MED ORDER — PROPOFOL 10 MG/ML IV BOLUS
INTRAVENOUS | Status: DC | PRN
  Administered 2018-05-01: 90 mg via INTRAVENOUS

## 2018-05-01 MED ORDER — PROPOFOL 500 MG/50ML IV EMUL
INTRAVENOUS | Status: AC
  Filled 2018-05-01: qty 50

## 2018-05-01 MED ORDER — LIDOCAINE HCL (PF) 1 % IJ SOLN: 2.0000 mL | Freq: Once | INTRAMUSCULAR | Status: DC

## 2018-05-01 MED ORDER — PROPOFOL 10 MG/ML IV BOLUS
INTRAVENOUS | Status: AC
  Filled 2018-05-01: qty 20

## 2018-05-01 MED ORDER — LIDOCAINE HCL (PF) 1 % IJ SOLN
INTRAMUSCULAR | Status: AC
  Filled 2018-05-01: qty 2

## 2018-05-01 NOTE — Anesthesia Post-op Follow-up Note (Signed)
Anesthesia QCDR form completed.        

## 2018-05-01 NOTE — H&P (Signed)
Arlyss Repress, MD [address]  [address]  [place], [place] [address]  Main: [phone]  Fax: [phone] Pager: [phone]  Primary Care Physician:  Glori Luis, MD Primary Gastroenterologist:  Dr. Arlyss Repress  Pre-Procedure History & Physical: HPI:  Erik Weaver is a 57 y.o. male is here for an colonoscopy.   Past Medical History:  Diagnosis Date  . Chickenpox   . Hyperlipidemia   . Migraines     History reviewed. No pertinent surgical history.  Prior to Admission medications   Medication Sig Start Date End Date Taking? Authorizing Provider  ezetimibe-simvastatin (VYTORIN) 10-40 MG tablet TAKE 1 TABLET BY MOUTH DAILY AT 6 PM. 10/27/17   Glori Luis, MD    Allergies as of 03/17/2018  . (No Known Allergies)    Family History  Problem Relation Age of Onset  . Alcoholism Unknown        Parent, other relative  . Arthritis Unknown        Parent, Other relative   . Colon cancer Father   . Heart disease Father   . Lung cancer Unknown        Grandparent  . Hyperlipidemia Unknown        Parent, other relative  . Hypertension Unknown        Parent, grandparent, other relative  . Diabetes Unknown        Parent, grandparent     Social History   Socioeconomic History  . Marital status: Single    Spouse name: Not on file  . Number of children: Not on file  . Years of education: Not on file  . Highest education level: Not on file  Occupational History  . Not on file  Social Needs  . Financial resource strain: Not on file  . Food insecurity:    Worry: Not on file    Inability: Not on file  . Transportation needs:    Medical: Not on file    Non-medical: Not on file  Tobacco Use  . Smoking status: Former Games developer  . Smokeless tobacco: Former Neurosurgeon    Types: Chew  Substance and Sexual Activity  . Alcohol use: Yes    Alcohol/week: 0.0 oz    Comment: Infrequent  . Drug use: No  . Sexual activity: Not on file  Lifestyle  . Physical  activity:    Days per week: Not on file    Minutes per session: Not on file  . Stress: Not on file  Relationships  . Social connections:    Talks on phone: Not on file    Gets together: Not on file    Attends religious service: Not on file    Active member of club or organization: Not on file    Attends meetings of clubs or organizations: Not on file    Relationship status: Not on file  . Intimate partner violence:    Fear of current or ex partner: Not on file    Emotionally abused: Not on file    Physically abused: Not on file    Forced sexual activity: Not on file  Other Topics Concern  . Not on file  Social History Narrative  . Not on file    Review of Systems: See HPI, otherwise negative ROS  Physical Exam: BP (!) 122/93   Pulse 65   Temp (!) 96.4 F (35.8 C)   Resp 17   Ht 5\' 6"  (1.676 m)   Wt 214 lb (  97.1 kg)   SpO2 99%   BMI 34.54 kg/m  General:   Alert,  pleasant and cooperative in NAD Head:  Normocephalic and atraumatic. Neck:  Supple; no masses or thyromegaly. Lungs:  Clear throughout to auscultation.    Heart:  Regular rate and rhythm. Abdomen:  Soft, nontender and nondistended. Normal bowel sounds, without guarding, and without rebound.   Neurologic:  Alert and  oriented x4;  grossly normal neurologically.  Impression/Plan: Erik Weaver is here for an colonoscopy to be performed for colon cancer screening  Risks, benefits, limitations, and alternatives regarding  colonoscopy have been reviewed with the patient.  Questions have been answered.  All parties agreeable.   Lannette Donathohini Gal Smolinski, MD  05/01/2018, 8:26 AM

## 2018-05-01 NOTE — Anesthesia Postprocedure Evaluation (Signed)
Anesthesia Post Note  Patient: Erik Weaver  Procedure(s) Performed: COLONOSCOPY WITH PROPOFOL (N/A )  Patient location during evaluation: Endoscopy Anesthesia Type: General Level of consciousness: awake and alert Pain management: pain level controlled Vital Signs Assessment: post-procedure vital signs reviewed and stable Respiratory status: spontaneous breathing, nonlabored ventilation, respiratory function stable and patient connected to nasal cannula oxygen Cardiovascular status: blood pressure returned to baseline and stable Postop Assessment: no apparent nausea or vomiting Anesthetic complications: no     Last Vitals:  Vitals:   05/01/18 0905 05/01/18 0915  BP: 108/80 109/82  Pulse: 64 65  Resp: 15 16  Temp:    SpO2: 97% 98%    Last Pain:  Vitals:   05/01/18 0905  TempSrc:   PainSc: 0-No pain                 Lenard SimmerAndrew Madhavi Hamblen

## 2018-05-01 NOTE — Transfer of Care (Signed)
Immediate Anesthesia Transfer of Care Note  Patient: Erik Weaver  Procedure(s) Performed: COLONOSCOPY WITH PROPOFOL (N/A )  Patient Location: Endoscopy Unit  Anesthesia Type:General  Level of Consciousness: drowsy and patient cooperative  Airway & Oxygen Therapy: Patient Spontanous Breathing  Post-op Assessment: Report given to RN and Post -op Vital signs reviewed and stable  Post vital signs: Reviewed and stable  Last Vitals:  Vitals Value Taken Time  BP 105/84 05/01/2018  8:56 AM  Temp 36.2 C 05/01/2018  8:56 AM  Pulse 74 05/01/2018  8:57 AM  Resp 16 05/01/2018  8:57 AM  SpO2 97 % 05/01/2018  8:57 AM  Vitals shown include unvalidated device data.  Last Pain:  Vitals:   05/01/18 0856  TempSrc: Tympanic         Complications: No apparent anesthesia complications

## 2018-05-01 NOTE — Anesthesia Preprocedure Evaluation (Signed)
Anesthesia Evaluation  Patient identified by MRN, date of birth, ID band Patient awake    Reviewed: Allergy & Precautions, H&P , NPO status , Patient's Chart, lab work & pertinent test results, reviewed documented beta blocker date and time   History of Anesthesia Complications Negative for: history of anesthetic complications  Airway Mallampati: III  TM Distance: >3 FB Neck ROM: full    Dental  (+) Dental Advidsory Given, Missing, Teeth Intact   Pulmonary neg pulmonary ROS, former smoker,           Cardiovascular Exercise Tolerance: Good negative cardio ROS       Neuro/Psych negative neurological ROS  negative psych ROS   GI/Hepatic negative GI ROS, Neg liver ROS,   Endo/Other  negative endocrine ROS  Renal/GU negative Renal ROS  negative genitourinary   Musculoskeletal   Abdominal   Peds  Hematology negative hematology ROS (+)   Anesthesia Other Findings Past Medical History: No date: Chickenpox No date: Hyperlipidemia No date: Migraines   Reproductive/Obstetrics negative OB ROS                             Anesthesia Physical Anesthesia Plan  ASA: II  Anesthesia Plan: General   Post-op Pain Management:    Induction: Intravenous  PONV Risk Score and Plan: 2 and Propofol infusion and TIVA  Airway Management Planned: Nasal Cannula  Additional Equipment:   Intra-op Plan:   Post-operative Plan:   Informed Consent: I have reviewed the patients History and Physical, chart, labs and discussed the procedure including the risks, benefits and alternatives for the proposed anesthesia with the patient or authorized representative who has indicated his/her understanding and acceptance.   Dental Advisory Given  Plan Discussed with: Anesthesiologist, CRNA and Surgeon  Anesthesia Plan Comments:         Anesthesia Quick Evaluation

## 2018-05-01 NOTE — Op Note (Addendum)
[facility] Gastroenterology Patient Name: Erik Weaver Procedure Date: 05/01/2018 7:57 AM MRN: [id number] Account #: [id number] Date of Birth: 03/03/1961 Admit Type: Outpatient Age: 57 Room: [facility] ENDO ROOM 2 Gender: Male Note Status: Finalized Procedure:            Colonoscopy Indications:          Screening for colorectal malignant neoplasm, Screening                        patient at increased risk: Family history of 1st-degree                        relative with colorectal cancer at age 30 years (or                        older), This is the patient's first colonoscopy Providers:            Lin Landsman MD, MD Referring MD:         Angela Adam. Caryl Bis (Referring MD) Medicines:            Monitored Anesthesia Care Complications:        No immediate complications. Estimated blood loss: None. Procedure:            Pre-Anesthesia Assessment:                       - Prior to the procedure, a History and Physical was                        performed, and patient medications and allergies were                        reviewed. The patient is competent. The risks and                        benefits of the procedure and the sedation options and                        risks were discussed with the patient. All questions                        were answered and informed consent was obtained.                        Patient identification and proposed procedure were                        verified by the physician, the nurse, the                        anesthesiologist, the anesthetist and the technician in                        the pre-procedure area in the procedure room in the                        endoscopy suite. Mental Status Examination: alert and                        oriented. Airway Examination: normal  oropharyngeal                        airway and neck mobility. Respiratory Examination:                        clear to auscultation. CV Examination: normal.                         Prophylactic Antibiotics: The patient does not require                        prophylactic antibiotics. Prior Anticoagulants: The                        patient has taken no previous anticoagulant or                        antiplatelet agents. ASA Grade Assessment: II - A                        patient with mild systemic disease. After reviewing the                        risks and benefits, the patient was deemed in                        satisfactory condition to undergo the procedure. The                        anesthesia plan was to use monitored anesthesia care                        (MAC). Immediately prior to administration of                        medications, the patient was re-assessed for adequacy                        to receive sedatives. The heart rate, respiratory rate,                        oxygen saturations, blood pressure, adequacy of                        pulmonary ventilation, and response to care were                        monitored throughout the procedure. The physical status                        of the patient was re-assessed after the procedure.                       After obtaining informed consent, the colonoscope was                        passed under direct vision. Throughout the procedure,                        the patient's blood pressure, pulse, and  oxygen                        saturations were monitored continuously. The                        Colonoscope was introduced through the anus and                        advanced to the the cecum, identified by appendiceal                        orifice and ileocecal valve. The colonoscopy was                        performed without difficulty. The patient tolerated the                        procedure well. The quality of the bowel preparation                        was evaluated using the BBPS [facility] [place] Bowel Preparation                        Scale) with scores of: Right Colon =  3, Transverse                        Colon = 3 and Left Colon = 3 (entire mucosa seen well                        with no residual staining, small fragments of stool or                        opaque liquid). The total BBPS score equals 9. Findings:      The perianal and digital rectal examinations were normal. Pertinent       negatives include normal sphincter tone and no palpable rectal lesions.      The colon (entire examined portion) appeared normal.      The retroflexed view of the distal rectum and anal verge was normal and       showed no anal or rectal abnormalities. Impression:           - The entire examined colon is normal.                       - The distal rectum and anal verge are normal on                        retroflexion view.                       - No specimens collected. Recommendation:       - Discharge patient to home (with escort).                       - Low sodium diet today.                       - Continue present medications.                       -  Repeat colonoscopy in 10 years for surveillance. Procedure Code(s):    --- Professional ---                       G9924, Colorectal cancer screening; colonoscopy on                        individual at high risk Diagnosis Code(s):    --- Professional ---                       Z12.11, Encounter for screening for malignant neoplasm                        of colon                       Z80.0, Family history of malignant neoplasm of                        digestive organs CPT copyright 2017 American Medical Association. All rights reserved. The codes documented in this report are preliminary and upon coder review may  be revised to meet current compliance requirements. Dr. Ulyess Mort Lin Landsman MD, MD 05/01/2018 8:53:33 AM This report has been signed electronically. Number of Addenda: 0 Note Initiated On: 05/01/2018 7:57 AM Scope Withdrawal Time: 0 hours 15 minutes 34 seconds  Total Procedure Duration:  0 hours 17 minutes 47 seconds       [facility]

## 2018-05-01 NOTE — Discharge Instructions (Signed)

## 2018-05-02 ENCOUNTER — Encounter: Payer: Self-pay | Admitting: Gastroenterology

## 2018-08-04 ENCOUNTER — Ambulatory Visit: Payer: 59 | Admitting: Family Medicine

## 2018-08-04 ENCOUNTER — Encounter: Payer: Self-pay | Admitting: Family Medicine

## 2018-08-04 VITALS — BP 118/82 | HR 73 | Temp 98.7°F | Ht 66.0 in | Wt 216.8 lb

## 2018-08-04 DIAGNOSIS — J069 Acute upper respiratory infection, unspecified: Secondary | ICD-10-CM

## 2018-08-04 DIAGNOSIS — R058 Other specified cough: Secondary | ICD-10-CM

## 2018-08-04 DIAGNOSIS — R05 Cough: Secondary | ICD-10-CM

## 2018-08-04 DIAGNOSIS — R0982 Postnasal drip: Secondary | ICD-10-CM

## 2018-08-04 MED ORDER — AZITHROMYCIN 250 MG PO TABS: ORAL_TABLET | ORAL | 0 refills | Status: DC

## 2018-08-04 MED ORDER — FEXOFENADINE-PSEUDOEPHED ER 180-240 MG PO TB24: 1.0000 | ORAL_TABLET | Freq: Every day | ORAL | 1 refills | Status: DC

## 2018-08-04 MED ORDER — BENZONATATE 100 MG PO CAPS: 100.0000 mg | ORAL_CAPSULE | Freq: Three times a day (TID) | ORAL | 0 refills | Status: DC | PRN

## 2018-08-04 NOTE — Progress Notes (Signed)
Subjective:    Patient ID: Erik Weaver, male    DOB: [birth year], 57 y.o.   MRN: [id number]  HPI   Patient presents to clinic with coughing, congestion and sneezing for about 2 weeks.  States felt better for a few days the beginning of this week, but then symptoms returned.  States he is taking over-the-counter TheraFlu and Alka-Seltzer cold and flu with minimal help.  States at times when he coughs the mucus will be a beige or white color.  Currently he does not take any over-the-counter antihistamine or allergy type medication.  Denies any known sick contacts.  Denies fever or chills.  Patient Active Problem List   Diagnosis Date Noted  . Colon cancer screening   . Family history of colon cancer in father 03/17/2018  . Vision changes 03/17/2018  . Class 1 obesity due to excess calories with serious comorbidity and body mass index (BMI) of 34.0 to 34.9 in adult 03/17/2018  . Allergic rhinitis 12/10/2016  . Headache 07/02/2016  . Encounter for general adult medical examination with abnormal findings 03/05/2016  . 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 Hyperlipidemia 03/07/2007  . 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 Smoking status: Former Games developer  . Smokeless tobacco: Former Neurosurgeon    Types: Chew  Substance Use Topics  . Alcohol use: Yes    Alcohol/week: 0.0 standard drinks    Comment: Infrequent   Review of Systems  Constitutional: Negative for chills, fatigue and fever.  HENT: +congestion, nasal drainage Eyes: Negative.   Respiratory: +cough, productive at times. Negative for shortness of breath and wheezing.   Cardiovascular: Negative for chest pain, palpitations and leg swelling.  Gastrointestinal: Negative for abdominal pain, diarrhea, nausea and vomiting.  Genitourinary: Negative for dysuria, frequency and urgency.  Musculoskeletal: Negative for arthralgias and myalgias.  Skin: Negative for color change, pallor and rash.  Neurological: Negative for syncope,  light-headedness and headaches.  Psychiatric/Behavioral: The patient is not nervous/anxious.       Objective:   Physical Exam  Constitutional: He is oriented to person, place, and time. No distress.  HENT:  Head: Normocephalic and atraumatic.  +mucosal edema and Post Nasal Drip. Fullness bilateral TMs  Eyes: Conjunctivae and EOM are normal. No scleral icterus.  Cardiovascular: Normal rate and regular rhythm.  Pulmonary/Chest: Effort normal and breath sounds normal. No respiratory distress. He has no wheezes. He has no rales.  Neurological: He is alert and oriented to person, place, and time.  Skin: Skin is warm and dry. No pallor.  Psychiatric: He has a normal mood and affect. His behavior is normal.  Nursing note and vitals reviewed.     Vitals:   08/04/18 1005  BP: 118/82  Pulse: 73  Temp: 98.7 F (37.1 C)  SpO2: 97%    Assessment & Plan:   Viral upper respiratory infection, productive cough, postnasal drip -- due to lungs being clear on exam, and postnasal drip patterning on back of throat I suspect symptoms are related to a viral URI/postnasal drip.  Patient will take Allegra-D once daily and also use Tessalon Perles as needed for cough.  Patient also given a Z-Pak, advised to hold off on taking this for the next few days.  If symptoms continue to not improve even with addition of Allegra-D and Tessalon Perles, advised he can take Z-Pak to cover any sort of bacterial respiratory infection.   Keep regular follow-up as planned.  Return to clinic if symptoms persist or continue  to worsen.

## 2018-08-04 NOTE — Patient Instructions (Signed)
Take allegra-D and tessalon perles for the next few days to see if this helps symptoms, if not getting better then start Zpak  Cough, Adult A cough helps to clear your throat and lungs. A cough may last only 2-3 weeks (acute), or it may last longer than 8 weeks (chronic). Many different things can cause a cough. A cough may be a sign of an illness or another medical condition. Follow these instructions at home:  Pay attention to any changes in your cough.  Take medicines only as told by your doctor. ? If you were prescribed an antibiotic medicine, take it as told by your doctor. Do not stop taking it even if you start to feel better. ? Talk with your doctor before you try using a cough medicine.  Drink enough fluid to keep your pee (urine) clear or pale yellow.  If the air is dry, use a cold steam vaporizer or humidifier in your home.  Stay away from things that make you cough at work or at home.  If your cough is worse at night, try using extra pillows to raise your head up higher while you sleep.  Do not smoke, and try not to be around smoke. If you need help quitting, ask your doctor.  Do not have caffeine.  Do not drink alcohol.  Rest as needed. Contact a doctor if:  You have new problems (symptoms).  You cough up yellow fluid (pus).  Your cough does not get better after 2-3 weeks, or your cough gets worse.  Medicine does not help your cough and you are not sleeping well.  You have pain that gets worse or pain that is not helped with medicine.  You have a fever.  You are losing weight and you do not know why.  You have night sweats. Get help right away if:  You cough up blood.  You have trouble breathing.  Your heartbeat is very fast. This information is not intended to replace advice given to you by your health care provider. Make sure you discuss any questions you have with your health care provider. Document Released: 06/17/2011 Document Revised: 03/11/2016  Document Reviewed: 12/11/2014 Elsevier Interactive Patient Education  [employer].

## 2018-09-22 ENCOUNTER — Ambulatory Visit: Payer: 59 | Admitting: Family Medicine

## 2018-09-22 ENCOUNTER — Encounter: Payer: Self-pay | Admitting: Family Medicine

## 2018-09-22 VITALS — BP 122/84 | HR 73 | Temp 98.5°F | Resp 16 | Ht 66.0 in | Wt 220.0 lb

## 2018-09-22 DIAGNOSIS — E785 Hyperlipidemia, unspecified: Secondary | ICD-10-CM

## 2018-09-22 DIAGNOSIS — F419 Anxiety disorder, unspecified: Secondary | ICD-10-CM | POA: Diagnosis not present

## 2018-09-22 DIAGNOSIS — E6609 Other obesity due to excess calories: Secondary | ICD-10-CM | POA: Diagnosis not present

## 2018-09-22 DIAGNOSIS — R635 Abnormal weight gain: Secondary | ICD-10-CM

## 2018-09-22 DIAGNOSIS — F32A Depression, unspecified: Secondary | ICD-10-CM

## 2018-09-22 DIAGNOSIS — Z6835 Body mass index (BMI) 35.0-35.9, adult: Secondary | ICD-10-CM

## 2018-09-22 DIAGNOSIS — F329 Major depressive disorder, single episode, unspecified: Secondary | ICD-10-CM

## 2018-09-22 NOTE — Progress Notes (Signed)
  Tommi Rumps, MD Phone: [phone]  Erik Weaver is a 57 y.o. male who presents today for f/u.  CC: hld, obesity, rosacea  HYPERLIPIDEMIA Symptoms Chest pain on exertion:  no   Leg claudication:   no Medications: Compliance- taking vytorin Right upper quadrant pain- no  Muscle aches- no  Obesity: Weight has continued to trend up.  He has been exercising lots over the last 6 weeks with resistance training and some cardio.  He has altered his eating habits and has been eating cleaner as well.  Does eat lunch out occasionally though tries to eat healthy when he does.  Anxiety/depression: Patient does note quite a bit of stress at work.  He has lots of deadlines.  He notes quite a bit of anxiety related to this.  Sometimes he does feel depressed though no SI.  He does struggle with some thoughts that he is compromising his beliefs with certain things at work.  He does note this is all mostly a reaction to work.  He notes everything else seems to be coming together well in his life.  Social History   Tobacco Use  Smoking Status Former Smoker  Smokeless Tobacco Former Systems developer  . Types: Chew     ROS see history of present illness  Objective  Physical Exam Vitals:   09/22/18 0814  BP: 122/84  Pulse: 73  Resp: 16  Temp: 98.5 F (36.9 C)  SpO2: 97%    BP Readings from Last 3 Encounters:  09/22/18 122/84  08/04/18 118/82  05/01/18 109/82   Wt Readings from Last 3 Encounters:  09/22/18 220 lb (99.8 kg)  08/04/18 216 lb 12.8 oz (98.3 kg)  05/01/18 214 lb (97.1 kg)    Physical Exam  Constitutional: No distress.  Neck: No thyromegaly present.  Cardiovascular: Normal rate, regular rhythm and normal heart sounds.  Pulmonary/Chest: Effort normal and breath sounds normal.  Musculoskeletal: He exhibits no edema.  Neurological: He is alert.  Skin: Skin is warm and dry. He is not diaphoretic.     Assessment/Plan: Please see individual problem list.  Obesity Encouraged  diet and exercise.  We will check a TSH to ensure that his thyroid is not contributing to his increasing weight.  Hyperlipidemia Continue Vytorin.  Check LDL.  Encouraged diet and exercise.  Anxiety and depression Patient does have some issues with this related to work.  Offered support.  Discussed option of seeing how he does with added exercise versus therapy versus medication.  He opted for exercise.  We will see him back in 3 to 4 months.  Orders Placed This Encounter  Procedures  . Comp Met (CMET)  . Direct LDL  . TSH    No orders of the defined types were placed in this encounter.    Tommi Rumps, MD [place]

## 2018-09-22 NOTE — Assessment & Plan Note (Signed)
Encouraged diet and exercise.  We will check a TSH to ensure that his thyroid is not contributing to his increasing weight.

## 2018-09-22 NOTE — Assessment & Plan Note (Signed)
Patient does have some issues with this related to work.  Offered support.  Discussed option of seeing how he does with added exercise versus therapy versus medication.  He opted for exercise.  We will see him back in 3 to 4 months.

## 2018-09-22 NOTE — Patient Instructions (Signed)
Nice to see you. We will check labs today.  Please continue to work on diet and exercise.

## 2018-09-22 NOTE — Assessment & Plan Note (Signed)
Continue Vytorin.  Check LDL.  Encouraged diet and exercise.

## 2018-09-23 ENCOUNTER — Other Ambulatory Visit: Payer: Self-pay | Admitting: Family Medicine

## 2018-09-23 DIAGNOSIS — R7989 Other specified abnormal findings of blood chemistry: Secondary | ICD-10-CM

## 2018-09-23 LAB — COMPREHENSIVE METABOLIC PANEL
ALT: 28 IU/L (ref 0–44)
AST: 25 IU/L (ref 0–40)
Albumin/Globulin Ratio: 1.7 (ref 1.2–2.2)
Albumin: 4.3 g/dL (ref 3.5–5.5)
Alkaline Phosphatase: 73 IU/L (ref 39–117)
BUN/Creatinine Ratio: 14 (ref 9–20)
BUN: 16 mg/dL (ref 6–24)
Bilirubin Total: 0.7 mg/dL (ref 0.0–1.2)
CALCIUM: 9.5 mg/dL (ref 8.7–10.2)
CO2: 22 mmol/L (ref 20–29)
CREATININE: 1.18 mg/dL (ref 0.76–1.27)
Chloride: 103 mmol/L (ref 96–106)
GFR, EST AFRICAN AMERICAN: 79 mL/min/{1.73_m2} (ref >59)
GFR, EST NON AFRICAN AMERICAN: 69 mL/min/{1.73_m2} (ref >59)
Globulin, Total: 2.5 g/dL (ref 1.5–4.5)
Glucose: 102 mg/dL — ABNORMAL HIGH (ref 65–99)
Potassium: 4.6 mmol/L (ref 3.5–5.2)
Sodium: 140 mmol/L (ref 134–144)
TOTAL PROTEIN: 6.8 g/dL (ref 6.0–8.5)

## 2018-09-23 LAB — LDL CHOLESTEROL, DIRECT: LDL DIRECT: 87 mg/dL (ref 0–99)

## 2018-09-23 LAB — TSH: TSH: 0.365 u[IU]/mL — AB (ref 0.450–4.500)

## 2018-10-06 ENCOUNTER — Other Ambulatory Visit (INDEPENDENT_AMBULATORY_CARE_PROVIDER_SITE_OTHER): Payer: 59

## 2018-10-06 DIAGNOSIS — R7989 Other specified abnormal findings of blood chemistry: Secondary | ICD-10-CM

## 2018-10-07 LAB — T3, FREE: T3, Free: 3.9 pg/mL (ref 2.0–4.4)

## 2018-10-07 LAB — T4, FREE: Free T4: 1.41 ng/dL (ref 0.82–1.77)

## 2018-10-13 ENCOUNTER — Telehealth: Payer: Self-pay | Admitting: Family Medicine

## 2018-10-13 NOTE — Telephone Encounter (Signed)
Copied from CRM [id number]. Topic: Quick Communication - See Telephone Encounter >> Oct 13, 2018 12:21 PM Lorrine KinMcGee, Srikar Chiang B, NT wrote: CRM for notification. See Telephone encounter for: 10/13/18. Patient calling about lab results from 10/06/18. States that he was told that he would  need to follow up on 4 weeks for a recheck. Patient would like to know what a recheck is needed if the levels were okay? Please advise. Would like a follow up call.

## 2018-10-17 ENCOUNTER — Telehealth: Payer: Self-pay | Admitting: Family Medicine

## 2018-10-17 NOTE — Telephone Encounter (Signed)
Called patient and left a VM to call back to be schedule for lab appt to recheck elevated thyroid levels. CRM [facility].

## 2018-10-17 NOTE — Telephone Encounter (Signed)
Results reviewed with patient, message from Dr. Birdie SonsSonnenberg read to patient.  Questions answered to patients satisfaction; verbalizes understanding.  Agreeable to F/U labs. Scheduled for 11/10/2018.

## 2018-10-17 NOTE — Telephone Encounter (Signed)
Per [facility] patient wads advised of his results and scheduled for lab work.

## 2018-10-23 ENCOUNTER — Other Ambulatory Visit: Payer: Self-pay | Admitting: Family Medicine

## 2018-10-23 DIAGNOSIS — E7800 Pure hypercholesterolemia, unspecified: Secondary | ICD-10-CM

## 2018-11-09 ENCOUNTER — Telehealth: Payer: Self-pay | Admitting: Radiology

## 2018-11-09 DIAGNOSIS — R7989 Other specified abnormal findings of blood chemistry: Secondary | ICD-10-CM

## 2018-11-09 NOTE — Telephone Encounter (Signed)
Pt coming in for labs tomorrow, please place future orders. Thank you.  

## 2018-11-10 ENCOUNTER — Other Ambulatory Visit (INDEPENDENT_AMBULATORY_CARE_PROVIDER_SITE_OTHER): Payer: 59

## 2018-11-10 DIAGNOSIS — R7989 Other specified abnormal findings of blood chemistry: Secondary | ICD-10-CM | POA: Diagnosis not present

## 2018-11-10 NOTE — Addendum Note (Signed)
Addended by: Penne Lash on: 11/10/2018 08:50 AM   Modules accepted: Orders

## 2018-11-11 LAB — T4, FREE: FREE T4: 1.4 ng/dL (ref 0.82–1.77)

## 2018-11-11 LAB — TSH: TSH: 0.398 u[IU]/mL — ABNORMAL LOW (ref 0.450–4.500)

## 2018-11-16 ENCOUNTER — Other Ambulatory Visit: Payer: Self-pay | Admitting: Family Medicine

## 2018-11-16 DIAGNOSIS — R7989 Other specified abnormal findings of blood chemistry: Secondary | ICD-10-CM

## 2018-12-22 ENCOUNTER — Ambulatory Visit: Payer: 59 | Admitting: Family Medicine

## 2018-12-26 DIAGNOSIS — R7989 Other specified abnormal findings of blood chemistry: Secondary | ICD-10-CM | POA: Diagnosis not present

## 2018-12-26 DIAGNOSIS — R946 Abnormal results of thyroid function studies: Secondary | ICD-10-CM | POA: Diagnosis not present

## 2018-12-29 DIAGNOSIS — R946 Abnormal results of thyroid function studies: Secondary | ICD-10-CM | POA: Diagnosis not present

## 2018-12-29 DIAGNOSIS — R7989 Other specified abnormal findings of blood chemistry: Secondary | ICD-10-CM | POA: Diagnosis not present

## 2019-01-05 ENCOUNTER — Ambulatory Visit: Payer: 59 | Admitting: Family Medicine

## 2019-01-09 ENCOUNTER — Ambulatory Visit (INDEPENDENT_AMBULATORY_CARE_PROVIDER_SITE_OTHER): Payer: 59 | Admitting: Family Medicine

## 2019-01-09 ENCOUNTER — Encounter: Payer: Self-pay | Admitting: Family Medicine

## 2019-01-09 ENCOUNTER — Other Ambulatory Visit: Payer: Self-pay

## 2019-01-09 DIAGNOSIS — F419 Anxiety disorder, unspecified: Secondary | ICD-10-CM | POA: Diagnosis not present

## 2019-01-09 DIAGNOSIS — E6609 Other obesity due to excess calories: Secondary | ICD-10-CM

## 2019-01-09 DIAGNOSIS — F32A Depression, unspecified: Secondary | ICD-10-CM

## 2019-01-09 DIAGNOSIS — F329 Major depressive disorder, single episode, unspecified: Secondary | ICD-10-CM | POA: Diagnosis not present

## 2019-01-09 DIAGNOSIS — E0590 Thyrotoxicosis, unspecified without thyrotoxic crisis or storm: Secondary | ICD-10-CM

## 2019-01-09 DIAGNOSIS — Z6835 Body mass index (BMI) 35.0-35.9, adult: Secondary | ICD-10-CM

## 2019-01-09 NOTE — Assessment & Plan Note (Signed)
No depression.  Some anxiety.  He will continue with exercise and schedule alteration.

## 2019-01-09 NOTE — Patient Instructions (Signed)
Nice to see you. Please continue to work on dietary changes.  Please try to get vegetables several times a day.  Please try to get 3 solid meals per day and do not replace meals with protein shakes.  Please work on portion control.  Please continue with exercise.

## 2019-01-09 NOTE — Assessment & Plan Note (Signed)
Asymptomatic.  Plan to recheck labs at next visit.

## 2019-01-09 NOTE — Assessment & Plan Note (Signed)
Discussed dietary changes and exercise.  He will make some alterations.  Will referral to a nutritionist.

## 2019-01-09 NOTE — Progress Notes (Signed)
  Marikay Alar, MD Phone: [phone]  Erik Weaver is a 58 y.o. male who presents today for follow-up.  Subclinical hyperthyroidism: Patient did see endocrinology.  He had recheck of his lab work which revealed a mildly low TSH though normal free T4 and TPO antibody.  He notes no palpitations, heat intolerance, tremor, or weight loss.  He does note increased hunger at times.  Anxiety/depression: He notes no depression.  He notes occasional anxiety though this seems to be pretty stable over his lifetime.  He does note some sleep issues though he thinks this was because his schedule was off with work.  He has gotten back to doing a more normal schedule and he has been exercising and his sleep has improved.  He notes no snoring.  He does wake up well rested.  No apneic episodes.  Obesity: Patient notes he is gotten back to doing heavy resistance training.  He typically has 2-3 protein shakes a day.  Not many vegetables.  He does eat a lot of fruit.  Social History   Tobacco Use  Smoking Status Former Smoker  Smokeless Tobacco Former Neurosurgeon  . Types: Chew     ROS see history of present illness  Objective  Physical Exam Vitals:   01/09/19 1558  BP: 104/80  Pulse: 94  Temp: 98.3 F (36.8 C)  SpO2: 96%    BP Readings from Last 3 Encounters:  01/09/19 104/80  09/22/18 122/84  08/04/18 118/82   Wt Readings from Last 3 Encounters:  01/09/19 219 lb 3.2 oz (99.4 kg)  09/22/18 220 lb (99.8 kg)  08/04/18 216 lb 12.8 oz (98.3 kg)    Physical Exam Constitutional:      General: He is not in acute distress.    Appearance: He is not diaphoretic.  Neck:     Thyroid: No thyroid mass, thyromegaly or thyroid tenderness.  Cardiovascular:     Rate and Rhythm: Normal rate and regular rhythm.     Heart sounds: Normal heart sounds.  Pulmonary:     Effort: Pulmonary effort is normal.     Breath sounds: Normal breath sounds.  Skin:    General: Skin is warm and dry.  Neurological:    Mental Status: He is alert.      Assessment/Plan: Please see individual problem list.  Anxiety and depression No depression.  Some anxiety.  He will continue with exercise and schedule alteration.  Obesity Discussed dietary changes and exercise.  He will make some alterations.  Will referral to a nutritionist.  Subclinical hyperthyroidism Asymptomatic.  Plan to recheck labs at next visit.   Orders Placed This Encounter  Procedures  . Amb ref to Medical Nutrition Therapy-MNT    Referral Priority:   Routine    Referral Type:   Consultation    Referral Reason:   Specialty Services Required    Requested Specialty:   Nutrition    Number of Visits Requested:   1    No orders of the defined types were placed in this encounter.    Marikay Alar, MD [facility]

## 2019-02-09 ENCOUNTER — Encounter: Payer: Self-pay | Admitting: Dietician

## 2019-02-09 ENCOUNTER — Encounter: Payer: 59 | Attending: Family Medicine | Admitting: Dietician

## 2019-02-09 ENCOUNTER — Other Ambulatory Visit: Payer: Self-pay

## 2019-02-09 VITALS — Ht 66.0 in | Wt 214.2 lb

## 2019-02-09 DIAGNOSIS — E6609 Other obesity due to excess calories: Secondary | ICD-10-CM | POA: Insufficient documentation

## 2019-02-09 DIAGNOSIS — Z6835 Body mass index (BMI) 35.0-35.9, adult: Secondary | ICD-10-CM | POA: Diagnosis not present

## 2019-02-09 DIAGNOSIS — Z6834 Body mass index (BMI) 34.0-34.9, adult: Secondary | ICD-10-CM

## 2019-02-09 NOTE — Progress Notes (Signed)
Medical Nutrition Therapy: Visit start time: 0915  end time: 1020  Assessment:  Diagnosis: overweight Past medical history: hyperlipidemia Psychosocial issues/ stress concerns: patient reports moderate - high stress level, feels he is dealing less than "ok" with stress  Preferred learning method:  . Auditory . Visual . Hands-on  Current weight: 214.2lbs Height: 5'6" Medications, supplements: reconciled list in medical record  Progress and evaluation:   Dad (died in his 47s) had heart disease, alcoholism, diabetes. Maternal history of diabetes  Patient reports normal weight in childhood, early adulthood; 195 was highest weight until a few years ago. Increased exercise, followed high protein (160g), low fat, slightly low carb, lost to 170lbs. Drifted away from that lifestyle with some stress eating and less exercise.  Feels most weight gain has happened in past 24 months along with change in body composition.    He would like help with being able to maintain healthy habits long term; to be able to lose weight and avoid regain. Wants accountability.  Physical activity: cardio and weight training (mostly resistance work) some sprinting and jumprope 90 minutes 3-4 x a week  Dietary Intake:  Usual eating pattern includes 3 meals and 2-3 snacks per day. Dining out frequency: 3-4 meals per week.  Breakfast: whole grain cereals with lowfat or skim milk; oatmeal with fruit + honey, peanut butter Snack: protein shake  Lunch: 11-12p grilled chicken or pot roast; egg salad sandwich at [employer]; chick fila; cheeseburger, sometimes protein shake if working out; sometimes eats at home Snack: small handful dark choc covered almonds, mixed nuts Supper: cereal; PBJ sandwich with chips; cheese and crackers Snack: none Beverages: up to 100oz water, choc milk with extra protein or [employer], sugar free soda 1-2x a day  Nutrition Care Education: Topics covered: weight control Basic nutrition: basic food  groups, appropriate nutrient balance    Weight control: identifying healthy weight, determining reasonable weight goal for height, age, and body composition; determined needs for weight loss to be 1600-1700kcal daily and provided guidance for 40% CHO, 30% protein, and 30% fat and used plate method for illustrating balanced meals; discussed strategies for developing permanent healthy habits, including allowing occasional "treats"; benefits of monitoring food intake; measures for accountability Other: briefly discussed Mediterranean style diet as heart healthy option.  Nutritional Diagnosis:  [place]-3.3 Overweight/obesity As related to excess calories.  As evidenced by patient with current BMI of 34.57, and patient report of dietary history.  Intervention:   Instruction and discussion as noted above.  Patient has maintained/ resumed multiple healthy diet and lifestyle habits and has begun to lose some weight.   Established dietary goals with direction from patient.   Will check on progress by email weekly until next scheduled visit.   Education Materials given:  . Plate Planner . Sample meal pattern/ menus . Goals/ instructions   Learner/ who was taught:  . Patient   Level of understanding: Marland Kitchen Verbalizes/ demonstrates competency   Demonstrated degree of understanding via:   Teach back Learning barriers: . None  Willingness to learn/ readiness for change: . Eager, change in progress  Monitoring and Evaluation:  Dietary intake, exercise, and body weight      follow up: 03/09/19

## 2019-02-09 NOTE — Patient Instructions (Signed)
   Keep journal of food intake daily to monitor calories and nutrient breakdown.   Continue to make low fat and low sugar food choices, great job!  Work to eat foods from home more often, pre-plan and pre-prep foods to save time and follow through with plans.   Pam will send emails periodically, likely once a week, to check on progress.

## 2019-03-09 ENCOUNTER — Encounter: Payer: Self-pay | Admitting: Dietician

## 2019-03-09 ENCOUNTER — Encounter: Payer: 59 | Attending: Family Medicine | Admitting: Dietician

## 2019-03-09 ENCOUNTER — Other Ambulatory Visit: Payer: Self-pay

## 2019-03-09 VITALS — Ht 66.0 in | Wt 219.8 lb

## 2019-03-09 DIAGNOSIS — E6609 Other obesity due to excess calories: Secondary | ICD-10-CM | POA: Insufficient documentation

## 2019-03-09 DIAGNOSIS — Z6835 Body mass index (BMI) 35.0-35.9, adult: Secondary | ICD-10-CM | POA: Diagnosis not present

## 2019-03-09 DIAGNOSIS — Z6834 Body mass index (BMI) 34.0-34.9, adult: Secondary | ICD-10-CM

## 2019-03-09 NOTE — Progress Notes (Signed)
Medical Nutrition Therapy: Visit start time: 1015  end time: 1045  Assessment:  Diagnosis: overweight Medical history changes: no changes Psychosocial issues/ stress concerns: increased stress in past several weeks  Current weight: 219.8 with steel-toed shoes  Height: 5'6" Medications, supplement changes: no changes per patient  Progress and evaluation:   Weight likely stable over the past month, appears higher today by about 5lbs due to heavy footwear.   Patient reports significant increase in physical activity/ workouts   Has been monitoring food intake and aiming for 1500-1700kcal  He states he is now more aware of kcal content in foods and is limiting higher-calorie restaurant foods.   Faces some challenge with resuming 11-hour workdays next week (has been working 5-6 hour shifts).   Physical activity: cardio and weight training 90 minutes, 4-5 times a week  Dietary Intake:  Usual eating pattern includes 3 meals and 2 snacks per day. Dining out frequency: not assessed today.  Breakfast: whole grain cereal or oatmeal with peanut butter and honey Snack: protein shake Lunch: recently at 2-3pm-- protein shake ie Premier, Pure protein bar, homemade shake with protein and other supplements Snack: none  Supper: cereal; sometimes full course meal Snack: none or same as am Beverages: water, milk/ protein shakes, sugar free soda occasionally  Nutrition Care Education: Topics covered: weight control Basic nutrition: appropriate meal and snack schedule    Weight control: reviewed progress since previous visit; discussed hidden sources of calories; benefits of tracking food intake; role of protein and carbohydrate in maximizing exercise benefits; body composition vs total body weight measurements.   Nutritional Diagnosis:  [place]-3.3 Overweight/obesity As related to history of excess calories.  As evidenced by patient with current BMI of 34.5, following dietary pattern to promote weight  loss.  Intervention:   Discussion as noted above.  Patient feels he is making positive progress with long-term healthy habits.   Updated goals with direction from patient.  RD to begin periodic email messages to check on progress.   Patient will schedule another RD follow-up after next MD visit in about 60 days.   Education Materials given:  Marland Kitchen Goals/ instructions  Learner/ who was taught:  . Patient    Level of understanding: Marland Kitchen Verbalizes/ demonstrates competency   Demonstrated degree of understanding via:   Teach back Learning barriers: . None  Willingness to learn/ readiness for change: . Eager, change in progress  Monitoring and Evaluation:  Dietary intake, exercise, and body weight      follow up: prn

## 2019-03-09 NOTE — Patient Instructions (Signed)
   Continue with current eating pattern; eat something every 3-5 hours during the day.   Can try including a small amount of healthy carb with protein shake after a workout, such as fruit, whole grain crackers or bread.  Plan to bring healthy meal and snack options to work to [employer] and vending machine snacks.   Continue to fit in regular workouts a much as able.

## 2019-05-02 ENCOUNTER — Encounter: Payer: Self-pay | Admitting: Dietician

## 2019-05-02 NOTE — Progress Notes (Signed)
Sent the following email message to patient for a regular check-in:  Hi Erik Weaver, I noticed it has already been several weeks since our last communication; how are you doing now? Have you been able to eat regularly and limit restaurant meals with some reopening and your work schedule? How is your exercise going? Any concerns, celebrations, questions for me? I look forward to hearing from you. Ferne Coe, RD, LDN, CDE [facility], [facility] [phone] [email or web address]

## 2019-05-16 ENCOUNTER — Other Ambulatory Visit: Payer: Self-pay

## 2019-05-18 ENCOUNTER — Ambulatory Visit (INDEPENDENT_AMBULATORY_CARE_PROVIDER_SITE_OTHER): Payer: 59 | Admitting: Family Medicine

## 2019-05-18 ENCOUNTER — Encounter: Payer: Self-pay | Admitting: Family Medicine

## 2019-05-18 ENCOUNTER — Other Ambulatory Visit: Payer: Self-pay

## 2019-05-18 VITALS — BP 120/80 | HR 66 | Temp 99.0°F | Ht 66.0 in | Wt 206.2 lb

## 2019-05-18 DIAGNOSIS — E669 Obesity, unspecified: Secondary | ICD-10-CM

## 2019-05-18 DIAGNOSIS — Z0000 Encounter for general adult medical examination without abnormal findings: Secondary | ICD-10-CM

## 2019-05-18 DIAGNOSIS — E785 Hyperlipidemia, unspecified: Secondary | ICD-10-CM | POA: Diagnosis not present

## 2019-05-18 DIAGNOSIS — Z125 Encounter for screening for malignant neoplasm of prostate: Secondary | ICD-10-CM

## 2019-05-18 DIAGNOSIS — E0590 Thyrotoxicosis, unspecified without thyrotoxic crisis or storm: Secondary | ICD-10-CM

## 2019-05-18 NOTE — Assessment & Plan Note (Signed)
Physical exam completed.  He will continue diet and exercise.  Congratulated on weight loss and healthier lifestyle.  He defers Shingrix given the potential for feeling ill afterwards and to check with his insurance.  Discussed condom use with sexual activity.  Lab work as outlined below.

## 2019-05-18 NOTE — Patient Instructions (Signed)
Nice to see you. Please continue with diet and exercise. We will get lab work today and contact you with results.

## 2019-05-18 NOTE — Addendum Note (Signed)
Addended by: Leone Haven on: 05/18/2019 09:06 AM   Modules accepted: Orders

## 2019-05-18 NOTE — Progress Notes (Signed)
Marikay AlarEric Brantlee Hinde, MD Phone: [phone]  Erik DockerRandall K Baughman is a 58 y.o. male who presents today for CPE.  Diet: This has gotten healthier.  He is doing intermittent fasting.  He is monitoring his caloric intake as well.  He has seen weight reduction in the last 5 weeks. Exercise: He is running and doing resistance training. Colonoscopy up-to-date 05/01/2018.  5-year recall. No family history of prostate cancer.  Due for screening. He is not currently sexually active though he has been in the past with women. He tetanus vaccine up-to-date.  He defers Shingrix today to check with his insurance regarding coverage. Hepatitis C and HIV screening up-to-date. No tobacco use or illicit drug use.  He socially drinks alcohol.  Denies alcohol abuse. Sees a dentist and ophthalmologist.  Active Ambulatory Problems    Diagnosis Date Noted  . 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 Hyperlipidemia 03/07/2007  . 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 Routine general medical examination at a health care facility 03/05/2016  . Headache 07/02/2016  . Allergic rhinitis 12/10/2016  . Family history of colon cancer in father 03/17/2018  . Vision changes 03/17/2018  . Obesity 03/17/2018  . Colon cancer screening   . Anxiety and depression 09/22/2018  . Subclinical hyperthyroidism 01/09/2019   Resolved Ambulatory Problems    Diagnosis Date Noted  . Establishing care with new doctor, encounter for 10/31/2015  . Obesity (BMI 30.0-34.9) 10/31/2015  . Foot pain 12/10/2016   Past Medical History:  Diagnosis Date  . Chickenpox   . Migraines     Family History  Problem Relation Age of Onset  . Alcoholism Unknown        Parent, other relative  . Arthritis Unknown        Parent, Other relative   . Colon cancer Father   . Heart disease Father   . Lung cancer Unknown        Grandparent  . Hyperlipidemia Unknown        Parent, other relative  . Hypertension Unknown        Parent, grandparent, other relative  . Diabetes Unknown         Parent, grandparent     Social History   Socioeconomic History  . Marital status: Single    Spouse name: Not on file  . Number of children: Not on file  . Years of education: Not on file  . Highest education level: Not on file  Occupational History  . Not on file  Social Needs  . Financial resource strain: Not on file  . Food insecurity    Worry: Not on file    Inability: Not on file  . Transportation needs    Medical: Not on file    Non-medical: Not on file  Tobacco Use  . Smoking status: Former Games developermoker  . Smokeless tobacco: Former NeurosurgeonUser    Types: Chew  Substance and Sexual Activity  . Alcohol use: Yes    Alcohol/week: 0.0 standard drinks    Comment: Infrequent  . Drug use: No  . Sexual activity: Not on file  Lifestyle  . Physical activity    Days per week: Not on file    Minutes per session: Not on file  . Stress: Not on file  Relationships  . Social Musicianconnections    Talks on phone: Not on file    Gets together: Not on file    Attends religious service: Not on file    Active member of club or organization: Not on file  Attends meetings of clubs or organizations: Not on file    Relationship status: Not on file  . Intimate partner violence    Fear of current or ex partner: Not on file    Emotionally abused: Not on file    Physically abused: Not on file    Forced sexual activity: Not on file  Other Topics Concern  . Not on file  Social History Narrative  . Not on file    ROS  General:  Negative for nexplained weight loss, fever Skin: Negative for new or changing mole, sore that won't heal HEENT: Negative for trouble hearing, trouble seeing, ringing in ears, mouth sores, hoarseness, change in voice, dysphagia. CV:  Negative for chest pain, dyspnea, edema, palpitations Resp: Negative for cough, dyspnea, hemoptysis GI: Negative for nausea, vomiting, diarrhea, constipation, abdominal pain, melena, hematochezia. GU: Negative for dysuria, incontinence,  urinary hesitance, hematuria, vaginal or penile discharge, polyuria, sexual difficulty, lumps in testicle or breasts MSK: Negative for muscle cramps or aches, joint pain or swelling Neuro: Negative for headaches, weakness, numbness, dizziness, passing out/fainting Psych: Negative for depression, anxiety, memory problems  Objective  Physical Exam Vitals:   05/18/19 0829  BP: 120/80  Pulse: 66  Temp: 99 F (37.2 C)  SpO2: 99%    BP Readings from Last 3 Encounters:  05/18/19 120/80  01/09/19 104/80  09/22/18 122/84   Wt Readings from Last 3 Encounters:  05/18/19 206 lb 3.2 oz (93.5 kg)  03/09/19 219 lb 12.8 oz (99.7 kg)  02/09/19 214 lb 3.2 oz (97.2 kg)    Physical Exam Constitutional:      General: He is not in acute distress.    Appearance: He is not diaphoretic.  Eyes:     Conjunctiva/sclera: Conjunctivae normal.     Pupils: Pupils are equal, round, and reactive to light.  Cardiovascular:     Rate and Rhythm: Normal rate and regular rhythm.     Heart sounds: Normal heart sounds.  Pulmonary:     Effort: Pulmonary effort is normal.     Breath sounds: Normal breath sounds.  Abdominal:     General: Bowel sounds are normal. There is no distension.     Palpations: Abdomen is soft. There is no mass.     Tenderness: There is no abdominal tenderness. There is no guarding or rebound.  Musculoskeletal:     Right lower leg: No edema.     Left lower leg: No edema.  Lymphadenopathy:     Cervical: No cervical adenopathy.  Skin:    General: Skin is warm and dry.  Neurological:     Mental Status: He is alert.  Psychiatric:        Mood and Affect: Mood normal.      Assessment/Plan:   Routine general medical examination at a health care facility Physical exam completed.  He will continue diet and exercise.  Congratulated on weight loss and healthier lifestyle.  He defers Shingrix given the potential for feeling ill afterwards and to check with his insurance.  Discussed  condom use with sexual activity.  Lab work as outlined below.   Orders Placed This Encounter  Procedures  . Comprehensive metabolic panel  . Lipid panel  . HgB A1c  . PSA  . TSH    No orders of the defined types were placed in this encounter.    Tommi Rumps, MD [place]

## 2019-05-18 NOTE — Addendum Note (Signed)
Addended by: Leeanne Rio on: 05/18/2019 09:05 AM   Modules accepted: Orders

## 2019-05-19 LAB — COMPREHENSIVE METABOLIC PANEL
ALT: 30 IU/L (ref 0–44)
AST: 25 IU/L (ref 0–40)
Albumin/Globulin Ratio: 2.1 (ref 1.2–2.2)
Albumin: 4.6 g/dL (ref 3.8–4.9)
Alkaline Phosphatase: 73 IU/L (ref 39–117)
BUN/Creatinine Ratio: 12 (ref 9–20)
BUN: 13 mg/dL (ref 6–24)
Bilirubin Total: 1.2 mg/dL (ref 0.0–1.2)
CO2: 21 mmol/L (ref 20–29)
Calcium: 9.4 mg/dL (ref 8.7–10.2)
Chloride: 105 mmol/L (ref 96–106)
Creatinine, Ser: 1.09 mg/dL (ref 0.76–1.27)
GFR calc Af Amer: 87 mL/min/{1.73_m2} (ref >59)
GFR calc non Af Amer: 75 mL/min/{1.73_m2} (ref >59)
Globulin, Total: 2.2 g/dL (ref 1.5–4.5)
Glucose: 91 mg/dL (ref 65–99)
Potassium: 4.2 mmol/L (ref 3.5–5.2)
Sodium: 142 mmol/L (ref 134–144)
Total Protein: 6.8 g/dL (ref 6.0–8.5)

## 2019-05-19 LAB — PSA: Prostate Specific Ag, Serum: 1 ng/mL (ref 0.0–4.0)

## 2019-05-19 LAB — LIPID PANEL
Chol/HDL Ratio: 1.8 ratio (ref 0.0–5.0)
Cholesterol, Total: 126 mg/dL (ref 100–199)
HDL: 70 mg/dL (ref >39)
LDL Calculated: 44 mg/dL (ref 0–99)
Triglycerides: 61 mg/dL (ref 0–149)
VLDL Cholesterol Cal: 12 mg/dL (ref 5–40)

## 2019-05-19 LAB — TSH: TSH: 0.184 u[IU]/mL — ABNORMAL LOW (ref 0.450–4.500)

## 2019-05-19 LAB — HEMOGLOBIN A1C
Est. average glucose Bld gHb Est-mCnc: 100 mg/dL
Hgb A1c MFr Bld: 5.1 % (ref 4.8–5.6)

## 2019-05-31 ENCOUNTER — Telehealth: Payer: Self-pay | Admitting: *Deleted

## 2019-05-31 DIAGNOSIS — E0590 Thyrotoxicosis, unspecified without thyrotoxic crisis or storm: Secondary | ICD-10-CM

## 2019-05-31 NOTE — Telephone Encounter (Signed)
Ordered

## 2019-05-31 NOTE — Telephone Encounter (Signed)
Please place future orders for lab appt.   Please choose [facility] when ordering his labs.  Thanks

## 2019-06-01 ENCOUNTER — Encounter: Payer: Self-pay | Admitting: Dietician

## 2019-06-01 NOTE — Progress Notes (Signed)
Sent email message to patient to check on his dietary progress for the past month.  Hi Erik Weaver, I'm checking in once again to see how the past few weeks have been for you. What has gone well for you recently, and what has not gone so well? Anything I can do to help you with nutrition a this point? Looking forward to hearing from you, Ferne Coe, RD, LDN, CDE [facility], [facility] [phone] [email or web address]

## 2019-06-05 ENCOUNTER — Other Ambulatory Visit: Payer: Self-pay

## 2019-06-05 ENCOUNTER — Other Ambulatory Visit (INDEPENDENT_AMBULATORY_CARE_PROVIDER_SITE_OTHER): Payer: 59

## 2019-06-05 DIAGNOSIS — E0590 Thyrotoxicosis, unspecified without thyrotoxic crisis or storm: Secondary | ICD-10-CM

## 2019-06-05 NOTE — Addendum Note (Signed)
Addended by: Elpidio Galea T on: 06/05/2019 03:50 PM   Modules accepted: Orders

## 2019-06-06 ENCOUNTER — Encounter: Payer: Self-pay | Admitting: Family Medicine

## 2019-06-06 LAB — TSH: TSH: 0.757 u[IU]/mL (ref 0.450–4.500)

## 2019-06-06 LAB — T4, FREE: Free T4: 1.36 ng/dL (ref 0.82–1.77)

## 2019-06-15 ENCOUNTER — Telehealth: Payer: Self-pay | Admitting: *Deleted

## 2019-06-15 NOTE — Telephone Encounter (Signed)
I called and informed the patient that a letter was sent and he stated he received it after the call he made to [place].  He understood and had no questions.  Nina,cma

## 2019-06-15 NOTE — Telephone Encounter (Signed)
Copied from [place] [id number]. Topic: General - Inquiry >> Jun 15, 2019  3:04 PM Erik Weaver wrote: Reason for CRM:   Pt calling to find out results of labs done two weeks ago - states he never heard anything

## 2019-10-04 ENCOUNTER — Other Ambulatory Visit: Payer: Self-pay | Admitting: Family Medicine

## 2019-10-04 DIAGNOSIS — E7800 Pure hypercholesterolemia, unspecified: Secondary | ICD-10-CM

## 2019-10-05 ENCOUNTER — Encounter: Payer: 59 | Attending: Family Medicine | Admitting: Dietician

## 2019-10-05 ENCOUNTER — Other Ambulatory Visit: Payer: Self-pay

## 2019-10-05 ENCOUNTER — Encounter: Payer: Self-pay | Admitting: Dietician

## 2019-10-05 VITALS — Ht 66.0 in | Wt 196.2 lb

## 2019-10-05 DIAGNOSIS — E6609 Other obesity due to excess calories: Secondary | ICD-10-CM | POA: Diagnosis not present

## 2019-10-05 DIAGNOSIS — Z6835 Body mass index (BMI) 35.0-35.9, adult: Secondary | ICD-10-CM | POA: Insufficient documentation

## 2019-10-05 DIAGNOSIS — Z713 Dietary counseling and surveillance: Secondary | ICD-10-CM | POA: Insufficient documentation

## 2019-10-05 DIAGNOSIS — Z6831 Body mass index (BMI) 31.0-31.9, adult: Secondary | ICD-10-CM

## 2019-10-05 NOTE — Patient Instructions (Signed)
   Expand eating schedule to 9am - 7pm or longer to meet energy needs and control hunger.   Continue with healthy food choices and limit higher sugar and fat foods.   Keep up the great exercise regimen!  Follow-up by email Jeannene Patella to initiate) in 1-2 months; schedule follow-up after then if needed.

## 2019-10-05 NOTE — Progress Notes (Signed)
Medical Nutrition Therapy: Visit start time: 8366  end time: 1100  Assessment:  Diagnosis: overweight Medical history changes: none Psychosocial issues/ stress concerns: none  Current weight: 196.2lbs Height: 5'6" Medications, supplement changes: no changes per patient  Progress and evaluation:  . Patient reports following intermittent fasting pattern, eating 11am - 7pm. He continues to monitor kcal intake, keeping to 1800-2000kcal most days. He does have some days over and some days below. . He states his weight has been stable recently, but has been eating a few more treats during holiday season and likely less exercise with shorter daylight. He is working full-time schedule now. . He would like to manage weight loss and kcal intake to best meet his needs as he nears his goal of 185lbs.    Physical activity:  45-90 minutes, cardio + weight training 3-4/7  Dietary Intake:  Usual eating pattern includes 2 meals and 2 snacks per day. Dining out frequency: 5 meals per week.  Breakfast: starts eating at 9-11am; kind bar or other protein bar Snack: none Lunch: often out, usuall makes healthy choices ie chicken souvlaki  Snack: almonds; V8 juice Supper: simple meal-- cereal; sandwich with chips; cheese and crackers. Snack: none Beverages: water, muscle milk or other protein drink, occ diet soda  Nutrition Care Education: Topics covered:     Weight control: reviewed progress since previous visit, determining reasonable weight loss rate, discussed kcal goal of 1800-2000 as appropriate for ongoing weight loss given his activity level and BMR of 1730 (as estimated by body composition scale); dealing with weight loss plateaus; appropriate meal timing/ schedule for ongoing weight loss   Nutritional Diagnosis:  [place]-3.3 Overweight/obesity As related to history of excess calories and inadequate physical activity.  As evidenced by patient with current BMI of 31.67, vollowing calorie-controlled  eating pattern for ongoing weight loss.  Intervention:  . Instruction and discussion as noted above. . Patient continues to manage eating and activity behaviors to promote weight loss and healthy lifestyle.  Marland Kitchen He will plan to expand his eating time to 10+ hours daily from his current 8.  . Will contact patient by email in 4-8 weeks to check on progress and schedule additional follow-up if needed.   Education Materials given:  Marland Kitchen Sample menus   Learner/ who was taught:  . Patient   Level of understanding: Marland Kitchen Verbalizes/ demonstrates competency  Demonstrated degree of understanding via:   Teach back Learning barriers: . None   Willingness to learn/ readiness for change: . Eager, change in progress  Monitoring and Evaluation:  Dietary intake, exercise, and body weight      follow up: in 3 month(s) as needed.

## 2020-03-03 ENCOUNTER — Telehealth (INDEPENDENT_AMBULATORY_CARE_PROVIDER_SITE_OTHER): Payer: 59 | Admitting: Family Medicine

## 2020-03-03 ENCOUNTER — Encounter: Payer: Self-pay | Admitting: Family Medicine

## 2020-03-03 DIAGNOSIS — Z7185 Encounter for immunization safety counseling: Secondary | ICD-10-CM | POA: Insufficient documentation

## 2020-03-03 DIAGNOSIS — Z7189 Other specified counseling: Secondary | ICD-10-CM | POA: Diagnosis not present

## 2020-03-03 NOTE — Progress Notes (Signed)
Virtual Visit via telephone Note  This visit type was conducted due to national recommendations for restrictions regarding the COVID-19 pandemic (e.g. social distancing).  This format is felt to be most appropriate for this patient at this time.  All issues noted in this document were discussed and addressed.  No physical exam was performed (except for noted visual exam findings with Video Visits).   I connected with Erik Weaver today at  4:00 PM EDT by  telephone and verified that I am speaking with the correct person using two identifiers. Location patient: work Location provider: work Persons participating in the virtual visit: patient, provider  I discussed the limitations, risks, security and privacy concerns of performing an evaluation and management service by telephone and the availability of in person appointments. I also discussed with the patient that there may be a patient responsible charge related to this service. The patient expressed understanding and agreed to proceed.  Interactive audio and video telecommunications were attempted between this provider and patient, however failed, due to patient having technical difficulties OR patient did not have access to video capability.  We continued and completed visit with audio only.   Reason for visit: Covid vaccine questions  HPI: Patient notes he is heard significant amounts of information regarding the Covid vaccine and wonders what is true and wonders about benefits and risks of getting the vaccine.   ROS: See pertinent positives and negatives per HPI.  Past Medical History:  Diagnosis Date  . Chickenpox   . Hyperlipidemia   . Migraines     Past Surgical History:  Procedure Laterality Date  . COLONOSCOPY WITH PROPOFOL N/A 05/01/2018   Procedure: COLONOSCOPY WITH PROPOFOL;  Surgeon: Lin Landsman, MD;  Location: [facility] ENDOSCOPY;  Service: Gastroenterology;  Laterality: N/A;    Family History  Problem Relation  Age of Onset  . Alcoholism Unknown        Parent, other relative  . Arthritis Unknown        Parent, Other relative   . Colon cancer Father   . Heart disease Father   . Lung cancer Unknown        Grandparent  . Hyperlipidemia Unknown        Parent, other relative  . Hypertension Unknown        Parent, grandparent, other relative  . Diabetes Unknown        Parent, grandparent     SOCIAL HX: Former smokeless tobacco user   Current Outpatient Medications:  .  ARGININE PO, Take by mouth., Disp: , Rfl:  .  aspirin EC 81 MG tablet, Take by mouth., Disp: , Rfl:  .  Biotin 10 MG CAPS, Take by mouth., Disp: , Rfl:  .  calcium carbonate (OS-CAL) 600 MG TABS tablet, Take by mouth., Disp: , Rfl:  .  Coenzyme Q10 400 MG CAPS, Take by mouth., Disp: , Rfl:  .  Cyanocobalamin 2500 MCG SUBL, Place under the tongue., Disp: , Rfl:  .  ezetimibe-simvastatin (VYTORIN) 10-40 MG tablet, TAKE 1 TABLET BY MOUTH DAILY AT 6 PM., Disp: 30 tablet, Rfl: 11 .  GLUTAMINE PO, Take by mouth., Disp: , Rfl:  .  Magnesium Oxide, Antacid, 500 MG CAPS, Take by mouth., Disp: , Rfl:  .  Multiple Vitamins-Minerals (MULTIVITAMIN ADULT PO), Take by mouth., Disp: , Rfl:  .  NIACIN CR PO, Take by mouth., Disp: , Rfl:  .  UNABLE TO FIND, Take by mouth., Disp: , Rfl:  .  vitamin  E 100 UNIT capsule, Take by mouth., Disp: , Rfl:   EXAM: This was a telephone visit and thus no physical exam was completed.  ASSESSMENT AND PLAN:  Discussed the following assessment and plan:  Vaccine counseling Discussed the various forms of the Covid vaccine.  Discussed that the Pfizer and moderna vaccines were made from slightly different technology than most other vaccines.  Discussed the [employer] vaccine was made in a more traditional manner.  Discussed that overall these vaccines have been found to be safe.  Discussed risk of short-term side effects such as body aches, fevers, chills.  Discussed the risk of blood clotting with the  [employer] vaccine though I did advise that its at a rate of 7 per million people and typically only seen in women aged 27-49.  Discussed that they are all equally effective at preventing death and hospitalization from Covid.  Discussed that the [facility] vaccine is slightly less effective at preventing Covid infection overall.  Advised that he should get whichever one he has access to.  He also wondered whether or not there would be a vaccine that was coming out that was made out of the virus.  I discussed that I did not know that though it would seem less likely to occur given how safe and effective the current vaccines are.   No orders of the defined types were placed in this encounter.   No orders of the defined types were placed in this encounter.    I discussed the assessment and treatment plan with the patient. The patient was provided an opportunity to ask questions and all were answered. The patient agreed with the plan and demonstrated an understanding of the instructions.   The patient was advised to call back or seek an in-person evaluation if the symptoms worsen or if the condition fails to improve as anticipated.  I provided 9 minutes of non-face-to-face time during this encounter.   Marikay Alar, MD

## 2020-03-03 NOTE — Assessment & Plan Note (Signed)
Discussed the various forms of the Covid vaccine.  Discussed that the Pfizer and moderna vaccines were made from slightly different technology than most other vaccines.  Discussed the [employer] vaccine was made in a more traditional manner.  Discussed that overall these vaccines have been found to be safe.  Discussed risk of short-term side effects such as body aches, fevers, chills.  Discussed the risk of blood clotting with the [employer] vaccine though I did advise that its at a rate of 7 per million people and typically only seen in women aged 80-49.  Discussed that they are all equally effective at preventing death and hospitalization from Covid.  Discussed that the [facility] vaccine is slightly less effective at preventing Covid infection overall.  Advised that he should get whichever one he has access to.  He also wondered whether or not there would be a vaccine that was coming out that was made out of the virus.  I discussed that I did not know that though it would seem less likely to occur given how safe and effective the current vaccines are.

## 2020-05-23 ENCOUNTER — Encounter: Payer: Self-pay | Admitting: Family Medicine

## 2020-05-23 ENCOUNTER — Other Ambulatory Visit: Payer: Self-pay

## 2020-05-23 ENCOUNTER — Ambulatory Visit (INDEPENDENT_AMBULATORY_CARE_PROVIDER_SITE_OTHER): Payer: 59 | Admitting: Family Medicine

## 2020-05-23 VITALS — BP 106/70 | HR 72 | Temp 98.6°F | Ht 66.0 in | Wt 204.2 lb

## 2020-05-23 DIAGNOSIS — R200 Anesthesia of skin: Secondary | ICD-10-CM

## 2020-05-23 DIAGNOSIS — Z0000 Encounter for general adult medical examination without abnormal findings: Secondary | ICD-10-CM

## 2020-05-23 DIAGNOSIS — E0590 Thyrotoxicosis, unspecified without thyrotoxic crisis or storm: Secondary | ICD-10-CM | POA: Diagnosis not present

## 2020-05-23 DIAGNOSIS — E6609 Other obesity due to excess calories: Secondary | ICD-10-CM | POA: Diagnosis not present

## 2020-05-23 DIAGNOSIS — E785 Hyperlipidemia, unspecified: Secondary | ICD-10-CM

## 2020-05-23 DIAGNOSIS — Z125 Encounter for screening for malignant neoplasm of prostate: Secondary | ICD-10-CM

## 2020-05-23 DIAGNOSIS — Z0001 Encounter for general adult medical examination with abnormal findings: Secondary | ICD-10-CM

## 2020-05-23 DIAGNOSIS — Z6832 Body mass index (BMI) 32.0-32.9, adult: Secondary | ICD-10-CM

## 2020-05-23 NOTE — Assessment & Plan Note (Signed)
Suspect nerve impingement of some degree.  Symptoms have been progressively improving recently.  We will give him exercises for his back and if worsening or not improving he can let us know for further evaluation with imaging or physical therapy.

## 2020-05-23 NOTE — Progress Notes (Signed)
Tommi Rumps, MD Phone: [phone]  Erik Weaver is a 59 y.o. male who presents today for CPE.  Diet: Patient has been doing some intermittent fasting.  He has been monitoring his diet has lost down to 195 pounds. Exercise: Patient's been exercising at home.  He got off track recently with some home renovations though is back on track now. Colonoscopy: 05/01/18 10 year recall Prostate cancer screening: due Family history-  Prostate cancer: No  Colon cancer: Father in his 14s Vaccines-   Flu: When it becomes available  Tetanus: UTD  Shingles: Patient will consider after he is through with his COVID-19 vaccine.  COVID19: Due HIV screening: UTD Hep C Screening: UTD Tobacco use: No Alcohol use: Occasional Illicit Drug use: No Dentist: Yes Ophthalmology: Yes  Left leg tingling: Patient notes a very focal aspect of his left lateral thigh with occasional tingling.  There is some slight decreased sensation at times.  He notes this has been occurring for several months though is much less frequent now.  No radicular symptoms.  No back pain.  No specific injury.  He did have a tick bite around the time that this started on his left lower leg.  The tick was not engorged and was not attached for very long.  He wondered if this was related.   Active Ambulatory Problems    Diagnosis Date Noted  . Hyperlipidemia 03/07/2007  . 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 Encounter for general adult medical examination with abnormal findings 03/05/2016  . Headache 07/02/2016  . Allergic rhinitis 12/10/2016  . Family history of colon cancer in father 03/17/2018  . Vision changes 03/17/2018  . Obesity 03/17/2018  . Colon cancer screening   . Anxiety and depression 09/22/2018  . Subclinical hyperthyroidism 01/09/2019  . Vaccine counseling 03/03/2020  . Focal left leg numbness 05/23/2020   Resolved Ambulatory Problems    Diagnosis Date Noted  . 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 Establishing care with new  doctor, encounter for 10/31/2015  . Obesity (BMI 30.0-34.9) 10/31/2015  . Foot pain 12/10/2016   Past Medical History:  Diagnosis Date  . Chickenpox   . Migraines     Family History  Problem Relation Age of Onset  . Alcoholism Unknown        Parent, other relative  . Arthritis Unknown        Parent, Other relative   . Colon cancer Father   . Heart disease Father   . Lung cancer Unknown        Grandparent  . Hyperlipidemia Unknown        Parent, other relative  . Hypertension Unknown        Parent, grandparent, other relative  . Diabetes Unknown        Parent, grandparent     Social History   Socioeconomic History  . Marital status: Single    Spouse name: Not on file  . Number of children: Not on file  . Years of education: Not on file  . Highest education level: Not on file  Occupational History  . Not on file  Tobacco Use  . Smoking status: Former Research scientist (life sciences)  . Smokeless tobacco: Former Systems developer    Types: Chew  Substance and Sexual Activity  . Alcohol use: Yes    Alcohol/week: 0.0 standard drinks    Comment: Infrequent  . Drug use: No  . Sexual activity: Not on file  Other Topics Concern  . Not on file  Social History Narrative  . Not on  file   Social Determinants of Health   Financial Resource Strain:   . Difficulty of Paying Living Expenses:   Food Insecurity:   . Worried About Charity fundraiser in the Last Year:   . Arboriculturist in the Last Year:   Transportation Needs:   . Film/video editor (Medical):   Marland Kitchen Lack of Transportation (Non-Medical):   Physical Activity:   . Days of Exercise per Week:   . Minutes of Exercise per Session:   Stress:   . Feeling of Stress :   Social Connections:   . Frequency of Communication with Friends and Family:   . Frequency of Social Gatherings with Friends and Family:   . Attends Religious Services:   . Active Member of Clubs or Organizations:   . Attends Archivist Meetings:   Marland Kitchen Marital Status:    Intimate Partner Violence:   . Fear of Current or Ex-Partner:   . Emotionally Abused:   Marland Kitchen Physically Abused:   . Sexually Abused:     ROS  General:  Negative for nexplained weight loss, fever Skin: Negative for new or changing mole, sore that won't heal HEENT: Positive for chronic tinnitus, negative for trouble hearing, trouble seeing, mouth sores, hoarseness, change in voice, dysphagia. CV:  Negative for chest pain, dyspnea, edema, palpitations Resp: Negative for cough, dyspnea, hemoptysis GI: Negative for nausea, vomiting, diarrhea, constipation, abdominal pain, melena, hematochezia. GU: Negative for dysuria, incontinence, urinary hesitance, hematuria, vaginal or penile discharge, polyuria, sexual difficulty, lumps in testicle or breasts MSK: Negative for muscle cramps or aches, joint pain or swelling Neuro: Negative for headaches, weakness, numbness, dizziness, passing out/fainting Psych: Negative for depression, anxiety, memory problems  Objective  Physical Exam Vitals:   05/23/20 0829  BP: 106/70  Pulse: 72  Temp: 98.6 F (37 C)  SpO2: 98%    BP Readings from Last 3 Encounters:  05/23/20 106/70  03/03/20 110/75  05/18/19 120/80   Wt Readings from Last 3 Encounters:  05/23/20 204 lb 3.2 oz (92.6 kg)  03/03/20 200 lb (90.7 kg)  10/05/19 196 lb 3.2 oz (89 kg)    Physical Exam Constitutional:      General: He is not in acute distress.    Appearance: He is not diaphoretic.  HENT:     Head: Normocephalic and atraumatic.  Eyes:     Conjunctiva/sclera: Conjunctivae normal.     Pupils: Pupils are equal, round, and reactive to light.  Cardiovascular:     Rate and Rhythm: Normal rate and regular rhythm.     Heart sounds: Normal heart sounds.  Pulmonary:     Effort: Pulmonary effort is normal.     Breath sounds: Normal breath sounds.  Abdominal:     General: Bowel sounds are normal. There is no distension.     Palpations: Abdomen is soft.     Tenderness: There  is no abdominal tenderness. There is no guarding or rebound.  Musculoskeletal:     Right lower leg: No edema.     Left lower leg: No edema.  Lymphadenopathy:     Cervical: No cervical adenopathy.  Skin:    General: Skin is warm and dry.       Neurological:     Mental Status: He is alert.     Comments: 5/5 strength bilateral quads, hamstrings, plantar flexion, and dorsiflexion, sensation light touch intact bilateral lower extremities, 2+ patellar reflexes  Psychiatric:        Mood and  Affect: Mood normal.      Assessment/Plan:   Encounter for general adult medical examination with abnormal findings Physical exam completed.  He will continue diet and exercise.  PSA for prostate cancer screening.  Colonoscopy is up-to-date.  Patient notes he will get the COVID-19 vaccine.  We discussed getting whichever one is available to him.  Discussed the safety of these and the benefit of preventing death and hospitalization.  Lab work as outlined below.  Focal left leg numbness Suspect nerve impingement of some degree.  Symptoms have been progressively improving recently.  We will give him exercises for his back and if worsening or not improving he can let us know for further evaluation with imaging or physical therapy.   Orders Placed This Encounter  Procedures  . Comp Met (CMET)  . TSH  . T4, free  . Lipid panel  . HgB A1c  . PSA    No orders of the defined types were placed in this encounter.   This visit occurred during the SARS-CoV-2 public health emergency.  Safety protocols were in place, including screening questions prior to the visit, additional usage of staff PPE, and extensive cleaning of exam room while observing appropriate contact time as indicated for disinfecting solutions.    Tommi Rumps, MD [facility]

## 2020-05-23 NOTE — Assessment & Plan Note (Signed)
Physical exam completed.  He will continue diet and exercise.  PSA for prostate cancer screening.  Colonoscopy is up-to-date.  Patient notes he will get the COVID-19 vaccine.  We discussed getting whichever one is available to him.  Discussed the safety of these and the benefit of preventing death and hospitalization.  Lab work as outlined below.

## 2020-05-23 NOTE — Patient Instructions (Signed)
Nice to see you. Please do the exercises for your back. Please continue work on diet and exercise. Please get the COVID-19 vaccine.  Once you are 2 weeks past the second dose of the COVID-19 vaccine you could consider getting the Shingrix vaccine.   Back Exercises The following exercises strengthen the muscles that help to support the trunk and back. They also help to keep the lower back flexible. Doing these exercises can help to prevent back pain or lessen existing pain.  If you have back pain or discomfort, try doing these exercises 2-3 times each day or as told by your health care provider.  As your pain improves, do them once each day, but increase the number of times that you repeat the steps for each exercise (do more repetitions).  To prevent the recurrence of back pain, continue to do these exercises once each day or as told by your health care provider. Do exercises exactly as told by your health care provider and adjust them as directed. It is normal to feel mild stretching, pulling, tightness, or discomfort as you do these exercises, but you should stop right away if you feel sudden pain or your pain gets worse. Exercises Single knee to chest Repeat these steps 3-5 times for each leg: 1. Lie on your back on a firm bed or the floor with your legs extended. 2. Bring one knee to your chest. Your other leg should stay extended and in contact with the floor. 3. Hold your knee in place by grabbing your knee or thigh with both hands and hold. 4. Pull on your knee until you feel a gentle stretch in your lower back or buttocks. 5. Hold the stretch for 10-30 seconds. 6. Slowly release and straighten your leg. Pelvic tilt Repeat these steps 5-10 times: 1. Lie on your back on a firm bed or the floor with your legs extended. 2. Bend your knees so they are pointing toward the ceiling and your feet are flat on the floor. 3. Tighten your lower abdominal muscles to press your lower back  against the floor. This motion will tilt your pelvis so your tailbone points up toward the ceiling instead of pointing to your feet or the floor. 4. With gentle tension and even breathing, hold this position for 5-10 seconds. Cat-cow Repeat these steps until your lower back becomes more flexible: 1. Get into a hands-and-knees position on a firm surface. Keep your hands under your shoulders, and keep your knees under your hips. You may place padding under your knees for comfort. 2. Let your head hang down toward your chest. Contract your abdominal muscles and point your tailbone toward the floor so your lower back becomes rounded like the back of a cat. 3. Hold this position for 5 seconds. 4. Slowly lift your head, let your abdominal muscles relax and point your tailbone up toward the ceiling so your back forms a sagging arch like the back of a cow. 5. Hold this position for 5 seconds.  Press-ups Repeat these steps 5-10 times: 1. Lie on your abdomen (face-down) on the floor. 2. Place your palms near your head, about shoulder-width apart. 3. Keeping your back as relaxed as possible and keeping your hips on the floor, slowly straighten your arms to raise the top half of your body and lift your shoulders. Do not use your back muscles to raise your upper torso. You may adjust the placement of your hands to make yourself more comfortable. 4. Hold this position  for 5 seconds while you keep your back relaxed. 5. Slowly return to lying flat on the floor.  Bridges Repeat these steps 10 times: 1. Lie on your back on a firm surface. 2. Bend your knees so they are pointing toward the ceiling and your feet are flat on the floor. Your arms should be flat at your sides, next to your body. 3. Tighten your buttocks muscles and lift your buttocks off the floor until your waist is at almost the same height as your knees. You should feel the muscles working in your buttocks and the back of your thighs. If you do  not feel these muscles, slide your feet 1-2 inches farther away from your buttocks. 4. Hold this position for 3-5 seconds. 5. Slowly lower your hips to the starting position, and allow your buttocks muscles to relax completely. If this exercise is too easy, try doing it with your arms crossed over your chest. Abdominal crunches Repeat these steps 5-10 times: 1. Lie on your back on a firm bed or the floor with your legs extended. 2. Bend your knees so they are pointing toward the ceiling and your feet are flat on the floor. 3. Cross your arms over your chest. 4. Tip your chin slightly toward your chest without bending your neck. 5. Tighten your abdominal muscles and slowly raise your trunk (torso) high enough to lift your shoulder blades a tiny bit off the floor. Avoid raising your torso higher than that because it can put too much stress on your low back and does not help to strengthen your abdominal muscles. 6. Slowly return to your starting position. Back lifts Repeat these steps 5-10 times: 1. Lie on your abdomen (face-down) with your arms at your sides, and rest your forehead on the floor. 2. Tighten the muscles in your legs and your buttocks. 3. Slowly lift your chest off the floor while you keep your hips pressed to the floor. Keep the back of your head in line with the curve in your back. Your eyes should be looking at the floor. 4. Hold this position for 3-5 seconds. 5. Slowly return to your starting position. Contact a health care provider if:  Your back pain or discomfort gets much worse when you do an exercise.  Your worsening back pain or discomfort does not lessen within 2 hours after you exercise. If you have any of these problems, stop doing these exercises right away. Do not do them again unless your health care provider says that you can. Get help right away if:  You develop sudden, severe back pain. If this happens, stop doing the exercises right away. Do not do them  again unless your health care provider says that you can. This information is not intended to replace advice given to you by your health care provider. Make sure you discuss any questions you have with your health care provider. Document Revised: 02/08/2019 Document Reviewed: 07/06/2018 Elsevier Patient Education  2020 [employer].

## 2020-05-24 LAB — COMPREHENSIVE METABOLIC PANEL
ALT: 30 IU/L (ref 0–44)
AST: 23 IU/L (ref 0–40)
Albumin/Globulin Ratio: 1.9 (ref 1.2–2.2)
Albumin: 4.6 g/dL (ref 3.8–4.9)
Alkaline Phosphatase: 77 IU/L (ref 48–121)
BUN/Creatinine Ratio: 16 (ref 9–20)
BUN: 18 mg/dL (ref 6–24)
Bilirubin Total: 1 mg/dL (ref 0.0–1.2)
CO2: 24 mmol/L (ref 20–29)
Calcium: 10.1 mg/dL (ref 8.7–10.2)
Chloride: 103 mmol/L (ref 96–106)
Creatinine, Ser: 1.14 mg/dL (ref 0.76–1.27)
GFR calc Af Amer: 81 mL/min/{1.73_m2} (ref >59)
GFR calc non Af Amer: 70 mL/min/{1.73_m2} (ref >59)
Globulin, Total: 2.4 g/dL (ref 1.5–4.5)
Glucose: 94 mg/dL (ref 65–99)
Potassium: 4.1 mmol/L (ref 3.5–5.2)
Sodium: 140 mmol/L (ref 134–144)
Total Protein: 7 g/dL (ref 6.0–8.5)

## 2020-05-24 LAB — T4, FREE: Free T4: 1.74 ng/dL (ref 0.82–1.77)

## 2020-05-24 LAB — LIPID PANEL
Chol/HDL Ratio: 2.4 ratio (ref 0.0–5.0)
Cholesterol, Total: 158 mg/dL (ref 100–199)
HDL: 65 mg/dL (ref >39)
LDL Chol Calc (NIH): 77 mg/dL (ref 0–99)
Triglycerides: 85 mg/dL (ref 0–149)
VLDL Cholesterol Cal: 16 mg/dL (ref 5–40)

## 2020-05-24 LAB — HEMOGLOBIN A1C
Est. average glucose Bld gHb Est-mCnc: 97 mg/dL
Hgb A1c MFr Bld: 5 % (ref 4.8–5.6)

## 2020-05-24 LAB — PSA: Prostate Specific Ag, Serum: 0.9 ng/mL (ref 0.0–4.0)

## 2020-05-24 LAB — TSH: TSH: 0.544 u[IU]/mL (ref 0.450–4.500)

## 2020-09-30 ENCOUNTER — Other Ambulatory Visit: Payer: Self-pay | Admitting: Family Medicine

## 2020-09-30 DIAGNOSIS — E7800 Pure hypercholesterolemia, unspecified: Secondary | ICD-10-CM

## 2020-11-20 ENCOUNTER — Other Ambulatory Visit: Payer: Self-pay

## 2020-11-24 ENCOUNTER — Encounter: Payer: Self-pay | Admitting: Family Medicine

## 2020-11-24 ENCOUNTER — Ambulatory Visit: Payer: 59 | Admitting: Family Medicine

## 2020-11-24 ENCOUNTER — Other Ambulatory Visit: Payer: Self-pay

## 2020-11-24 DIAGNOSIS — E6609 Other obesity due to excess calories: Secondary | ICD-10-CM | POA: Diagnosis not present

## 2020-11-24 DIAGNOSIS — F32A Depression, unspecified: Secondary | ICD-10-CM | POA: Diagnosis not present

## 2020-11-24 DIAGNOSIS — Z6832 Body mass index (BMI) 32.0-32.9, adult: Secondary | ICD-10-CM

## 2020-11-24 DIAGNOSIS — F419 Anxiety disorder, unspecified: Secondary | ICD-10-CM

## 2020-11-24 DIAGNOSIS — E785 Hyperlipidemia, unspecified: Secondary | ICD-10-CM | POA: Diagnosis not present

## 2020-11-24 NOTE — Assessment & Plan Note (Signed)
He will continue Vytorin 1 tablet daily.

## 2020-11-24 NOTE — Progress Notes (Signed)
Marikay Alar, MD Phone: [phone]  Erik Weaver is a 60 y.o. male who presents today for f/u.  HYPERLIPIDEMIA Symptoms Chest pain on exertion:  no   Leg claudication:   no Medications: Compliance- taking vytorin Right upper quadrant pain- no  Muscle aches- no  Obesity: Patient has been exercising 3 days a week with jumping rope and lifting weights.  His diet was doing well until the last month or so as he has had quite a hectic schedule with work with the weather.  H he feels his weight is likely down given the amount of close he has on currently and his steel toed boots.  He also notes he stress eats.  Stress: Patient notes a lot of stress through work.  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  Depression screen [facility] 2/9 11/24/2020 11/24/2020 05/23/2020  Decreased Interest 1 0 0  Down, Depressed, Hopeless 1 0 0  PHQ - 2 Score 2 0 0  Altered sleeping 1 - 0  Tired, decreased energy 1 - 0  Change in appetite 1 - 0  Feeling bad or failure about yourself  0 - 0  Trouble concentrating 1 - 0  Moving slowly or fidgety/restless 1 - 0  Suicidal thoughts 0 - 0  PHQ-9 Score 7 - 0  Difficult doing work/chores - - Not difficult at all   GAD 7 : Generalized Anxiety Score 11/24/2020 05/23/2020 09/22/2018  Nervous, Anxious, on Edge 1 1 2   Control/stop worrying 2 1 2   Worry too much - different things 1 0 1  Trouble relaxing 2 0 1  Restless 0 0 1  Easily annoyed or irritable 2 1 3   Afraid - awful might happen 0 0 1  Total GAD 7 Score 8 3 11   Anxiety Difficulty Somewhat difficult Not difficult at all -      Social History   Tobacco Use  Smoking Status Former Smoker  Smokeless Tobacco Former  . Types: Chew    Current Outpatient Medications on File Prior to Visit  Medication Sig Dispense Refill  . ARGININE PO Take by mouth.    aspirin  EC 81 MG tablet Take by mouth.    . Biotin 10 MG CAPS Take by mouth.    . calcium carbonate (OS-CAL) 600 MG TABS tablet Take by mouth.    . Coenzyme Q10 400 MG CAPS Take by mouth.    . Cyanocobalamin 2500 MCG SUBL Place under the tongue.    . ezetimibe-simvastatin (VYTORIN) 10-40 MG tablet TAKE 1 TABLET BY MOUTH DAILY AT 6 PM. 30 tablet 11  . GLUTAMINE PO Take by mouth.    . Magnesium Oxide, Antacid, 500 MG CAPS Take by mouth.    . Multiple Vitamins-Minerals (MULTIVITAMIN ADULT PO) Take by mouth.    NIACIN CR PO Take by mouth.    UNABLE TO FIND Take by mouth.    . vitamin E 100 UNIT capsule Take by mouth.     No current facility-administered medications on file prior to visit.     ROS see history of present illness  Objective  Physical Exam Vitals:   11/24/20 1532  BP: 115/80  Pulse: 83  Temp: 99 F (37.2 C)  SpO2: 97%    BP Readings from Last 3 Encounters:  11/24/20 115/80  05/23/20  106/70  03/03/20 110/75   Wt Readings from Last 3 Encounters:  11/24/20 204 lb 12.8 oz (92.9 kg)  05/23/20 204 lb 3.2 oz (92.6 kg)  03/03/20 200 lb (90.7 kg)    Physical Exam Constitutional:      General: He is not in acute distress.    Appearance: He is not diaphoretic.  Cardiovascular:     Rate and Rhythm: Normal rate and regular rhythm.     Heart sounds: Normal heart sounds.  Pulmonary:     Effort: Pulmonary effort is normal.     Breath sounds: Normal breath sounds.  Musculoskeletal:        General: No edema.  Skin:    General: Skin is warm and dry.  Neurological:     Mental Status: He is alert.      Assessment/Plan: Please see individual problem list.  Problem List Items Addressed This Visit    Anxiety and depression    Seems to be more of an issue recently.  He does have quite a bit of stress at work.  Discussed options of medication versus therapy though he declines these at this time and opts to try to change his lifestyle to include more things that he enjoys  first.  We will see him back in 3 months to see how he is doing.  He was advised to contact us if he worsens in any manner.      Hyperlipidemia    He will continue Vytorin 1 tablet daily.      Obesity    Continue with exercise.  Encouraged him to get back to his dietary changes.          Health Maintenance: The patient declines flu vaccine though he does plan on getting the Shingrix vaccine through the pharmacy.     This visit occurred during the SARS-CoV-2 public health emergency.  Safety protocols were in place, including screening questions prior to the visit, additional usage of staff PPE, and extensive cleaning of exam room while observing appropriate contact time as indicated for disinfecting solutions.    Marikay Alar, MD [facility]

## 2020-11-24 NOTE — Assessment & Plan Note (Signed)
Seems to be more of an issue recently.  He does have quite a bit of stress at work.  Discussed options of medication versus therapy though he declines these at this time and opts to try to change his lifestyle to include more things that he enjoys first.  We will see him back in 3 months to see how he is doing.  He was advised to contact us if he worsens in any manner.

## 2020-11-24 NOTE — Assessment & Plan Note (Signed)
Continue with exercise.  Encouraged him to get back to his dietary changes.

## 2020-11-24 NOTE — Patient Instructions (Signed)
Nice to see you. Please continue with diet and exercise. 

## 2021-02-23 ENCOUNTER — Other Ambulatory Visit: Payer: Self-pay

## 2021-02-23 ENCOUNTER — Ambulatory Visit: Payer: 59 | Admitting: Family Medicine

## 2021-02-23 ENCOUNTER — Encounter: Payer: Self-pay | Admitting: Family Medicine

## 2021-02-23 DIAGNOSIS — Z7185 Encounter for immunization safety counseling: Secondary | ICD-10-CM | POA: Diagnosis not present

## 2021-02-23 DIAGNOSIS — F419 Anxiety disorder, unspecified: Secondary | ICD-10-CM

## 2021-02-23 DIAGNOSIS — E6609 Other obesity due to excess calories: Secondary | ICD-10-CM | POA: Diagnosis not present

## 2021-02-23 DIAGNOSIS — F32A Depression, unspecified: Secondary | ICD-10-CM

## 2021-02-23 DIAGNOSIS — Z6834 Body mass index (BMI) 34.0-34.9, adult: Secondary | ICD-10-CM

## 2021-02-23 NOTE — Progress Notes (Signed)
Tommi Rumps, MD Phone: [phone]  Erik Weaver is a 60 y.o. male who presents today for follow-up.  Anxiety/stress: Patient notes he has become more aware of things at work that were affecting him.  He had a lot of pressure on him as he had more responsibility with managing things.  He met with a retirement specialist and has come up with a date of 10/17/2022 as his last day of work.  He also notes his dog has had pneumonia and has been on antibiotics intermittently over the last 6 weeks.  He does not believe he is depressed.  No SI.  Obesity: The patient notes with all the stress he ends up grabbing things and maybe he should not.  He does eat healthy most of the time though notes portion control has been an issue.  He did see a nutritionist and notes he did lose some weight when he was seeing them possibly related to the accountability of seeing them.  He does exercise with weights 3 to 4 days a week.  He feels as though he has a lack of accountability for his exercise and diet.  Shingles vaccine: The patient wonders about the shingles vaccine.  He was reading about potential side effects of read about Guillain-Barr and was concerned about that.  He does recognize that that is a very uncommon side effect.  Social History   Tobacco Use  Smoking Status Former Smoker  Smokeless Tobacco Former Systems developer  . Types: Chew    Current Outpatient Medications on File Prior to Visit  Medication Sig Dispense Refill  . ARGININE PO Take by mouth.    Marland Kitchen aspirin EC 81 MG tablet Take by mouth.    . Biotin 10 MG CAPS Take by mouth.    . calcium carbonate (OS-CAL) 600 MG TABS tablet Take by mouth.    . Coenzyme Q10 400 MG CAPS Take by mouth.    . Cyanocobalamin 2500 MCG SUBL Place under the tongue.    . ezetimibe-simvastatin (VYTORIN) 10-40 MG tablet TAKE 1 TABLET BY MOUTH DAILY AT 6 PM. 30 tablet 11  . GLUTAMINE PO Take by mouth.    . Magnesium Oxide, Antacid, 500 MG CAPS Take by mouth.    .  Multiple Vitamins-Minerals (MULTIVITAMIN ADULT PO) Take by mouth.    Marland Kitchen NIACIN CR PO Take by mouth.    Marland Kitchen UNABLE TO FIND Take by mouth.    . vitamin E 100 UNIT capsule Take by mouth.     No current facility-administered medications on file prior to visit.     ROS see history of present illness  Objective  Physical Exam Vitals:   02/23/21 1538  BP: 110/70  Pulse: 78  Temp: 98.1 F (36.7 C)  SpO2: 99%    BP Readings from Last 3 Encounters:  02/23/21 110/70  11/24/20 115/80  05/23/20 106/70   Wt Readings from Last 3 Encounters:  02/23/21 215 lb 3.2 oz (97.6 kg)  11/24/20 204 lb 12.8 oz (92.9 kg)  05/23/20 204 lb 3.2 oz (92.6 kg)    Physical Exam Constitutional:      General: He is not in acute distress.    Appearance: He is not diaphoretic.  Cardiovascular:     Rate and Rhythm: Normal rate and regular rhythm.     Heart sounds: Normal heart sounds.  Pulmonary:     Effort: Pulmonary effort is normal.     Breath sounds: Normal breath sounds.  Skin:    General: Skin  is warm and dry.  Neurological:     Mental Status: He is alert.      Assessment/Plan: Please see individual problem list.  Problem List Items Addressed This Visit    Obesity    We discussed diet and exercise.  I encouraged continued exercise.  We discussed calorie counting.  He was given a goal of 1980 cal/day for 1 pound of weight loss per week.  We did discuss the 2 pounds weight loss per week would require a calorie goal of 1480 and that is not necessarily reasonable for him.  He wanted to be seen back in about 4 months with a goal of weighing 180-185.  He does note he weighs 205 at home so that could be a reasonable goal.      Anxiety and depression    Relatively stable.  We did discuss the potential for starting medication for this though he declines this and wants to work on diet and exercise as well as lifestyle modification.  He feels as though things have started to improve with him planning on  retiring.  He will monitor.      Vaccine counseling    We had a discussion regarding the Shingrix vaccine.  I did encourage him to get this completed as I feel the benefit of this vaccine outweighs the risk to him.  Discussed the risk of Guillain-Barr syndrome is fairly small with this vaccine.  Discussed a sending weakness as the symptom of Gamber Ray.  I did discuss that he will likely feel generally poorly for a day or so after getting the vaccine.         Return in about 4 months (around 06/26/2021) for Weight.  This visit occurred during the SARS-CoV-2 public health emergency.  Safety protocols were in place, including screening questions prior to the visit, additional usage of staff PPE, and extensive cleaning of exam room while observing appropriate contact time as indicated for disinfecting solutions.    Tommi Rumps, MD [place]

## 2021-02-23 NOTE — Assessment & Plan Note (Signed)
We had a discussion regarding the Shingrix vaccine.  I did encourage him to get this completed as I feel the benefit of this vaccine outweighs the risk to him.  Discussed the risk of Guillain-Barr syndrome is fairly small with this vaccine.  Discussed a sending weakness as the symptom of Gamber Ray.  I did discuss that he will likely feel generally poorly for a day or so after getting the vaccine.

## 2021-02-23 NOTE — Patient Instructions (Signed)
Nice to see you. Your calorie goal to lose 1 pound of weight per week is 1980.  To lose 2 pounds of weight per week is 1480.  Never drop less than 1200 cal.

## 2021-02-23 NOTE — Assessment & Plan Note (Signed)
We discussed diet and exercise.  I encouraged continued exercise.  We discussed calorie counting.  He was given a goal of 1980 cal/day for 1 pound of weight loss per week.  We did discuss the 2 pounds weight loss per week would require a calorie goal of 1480 and that is not necessarily reasonable for him.  He wanted to be seen back in about 4 months with a goal of weighing 180-185.  He does note he weighs 205 at home so that could be a reasonable goal.

## 2021-02-23 NOTE — Assessment & Plan Note (Signed)
Relatively stable.  We did discuss the potential for starting medication for this though he declines this and wants to work on diet and exercise as well as lifestyle modification.  He feels as though things have started to improve with him planning on retiring.  He will monitor.

## 2021-06-26 ENCOUNTER — Ambulatory Visit: Payer: 59 | Admitting: Family Medicine

## 2021-06-29 ENCOUNTER — Ambulatory Visit: Payer: 59 | Admitting: Family Medicine

## 2021-08-19 ENCOUNTER — Ambulatory Visit (INDEPENDENT_AMBULATORY_CARE_PROVIDER_SITE_OTHER): Payer: 59 | Admitting: Family Medicine

## 2021-08-19 ENCOUNTER — Encounter: Payer: Self-pay | Admitting: Family Medicine

## 2021-08-19 ENCOUNTER — Other Ambulatory Visit: Payer: Self-pay

## 2021-08-19 VITALS — BP 110/80 | HR 80 | Temp 98.3°F | Ht 66.0 in | Wt 214.4 lb

## 2021-08-19 DIAGNOSIS — Z125 Encounter for screening for malignant neoplasm of prostate: Secondary | ICD-10-CM | POA: Diagnosis not present

## 2021-08-19 DIAGNOSIS — Z6834 Body mass index (BMI) 34.0-34.9, adult: Secondary | ICD-10-CM

## 2021-08-19 DIAGNOSIS — E6609 Other obesity due to excess calories: Secondary | ICD-10-CM

## 2021-08-19 DIAGNOSIS — E0590 Thyrotoxicosis, unspecified without thyrotoxic crisis or storm: Secondary | ICD-10-CM

## 2021-08-19 DIAGNOSIS — L989 Disorder of the skin and subcutaneous tissue, unspecified: Secondary | ICD-10-CM

## 2021-08-19 DIAGNOSIS — E785 Hyperlipidemia, unspecified: Secondary | ICD-10-CM | POA: Diagnosis not present

## 2021-08-19 NOTE — Patient Instructions (Addendum)
Nice to see you. Somebody will call you about the ultrasounds as well as the referral. We will get lab work.

## 2021-08-19 NOTE — Assessment & Plan Note (Addendum)
Discussed referral to the weight management clinic in [place] as they may be able to provide additional and more in-depth counseling and treatment options.  Referral placed.  Discussed lab evaluation to evaluate for hormonal contributing factors to his difficulty losing weight.

## 2021-08-19 NOTE — Progress Notes (Signed)
Tommi Rumps, MD Phone: [phone]  Erik Weaver is a 60 y.o. male who presents today for follow-up.  Obesity: Patient notes has been more sedentary than usual.  He notes he did well with his diet in July though got sidetracked after getting sick in August with a viral illness.  He has had trouble getting back on track.  He wonders what else he can do to help him lose weight.  He wonders if there is some hormonal component to this.  Left elbow lesion/right shoulder lesion: Patient notes the puffiness over his left elbow has been present for some time.  He notes there is an occasional soreness in the area.  This is in the area of the lateral epicondyle.  He has a lump over his right shoulder that has been present for 30 to 45 days.  He notes it is not tender.  It is not sore.  Social History   Tobacco Use  Smoking Status Former  Smokeless Tobacco Former   Types: Chew    Current Outpatient Medications on File Prior to Visit  Medication Sig Dispense Refill   ARGININE PO Take by mouth.     aspirin EC 81 MG tablet Take by mouth.     Biotin 10 MG CAPS Take by mouth.     calcium carbonate (OS-CAL) 600 MG TABS tablet Take by mouth.     Coenzyme Q10 400 MG CAPS Take by mouth.     Cyanocobalamin 2500 MCG SUBL Place under the tongue.     ezetimibe-simvastatin (VYTORIN) 10-40 MG tablet TAKE 1 TABLET BY MOUTH DAILY AT 6 PM. 30 tablet 11   GLUTAMINE PO Take by mouth.     Magnesium Oxide, Antacid, 500 MG CAPS Take by mouth.     Multiple Vitamins-Minerals (MULTIVITAMIN ADULT PO) Take by mouth.     NIACIN CR PO Take by mouth.     UNABLE TO FIND Take by mouth.     vitamin E 100 UNIT capsule Take by mouth.     No current facility-administered medications on file prior to visit.     ROS see history of present illness  Objective  Physical Exam Vitals:   08/19/21 1558  BP: 110/80  Pulse: 80  Temp: 98.3 F (36.8 C)  SpO2: 97%    BP Readings from Last 3 Encounters:  08/19/21  110/80  02/23/21 110/70  11/24/20 115/80   Wt Readings from Last 3 Encounters:  08/19/21 214 lb 6.4 oz (97.3 kg)  02/23/21 215 lb 3.2 oz (97.6 kg)  11/24/20 204 lb 12.8 oz (92.9 kg)    Physical Exam Constitutional:      General: He is not in acute distress.    Appearance: He is not diaphoretic.  Cardiovascular:     Rate and Rhythm: Normal rate and regular rhythm.     Heart sounds: Normal heart sounds.  Pulmonary:     Effort: Pulmonary effort is normal.     Breath sounds: Normal breath sounds.  Musculoskeletal:       Arms:  Skin:    General: Skin is warm and dry.  Neurological:     Mental Status: He is alert.     Assessment/Plan: Please see individual problem list.  Problem List Items Addressed This Visit     Hyperlipidemia   Relevant Orders   Lipid panel   Comp Met (CMET)   Lesion of subcutaneous tissue    The patient has a subcutaneous lesion near his right shoulder and over his  left elbow.  Discussed that these may represent lipomas though an ultrasound would allow [place] to fully characterize these lesions.  These will be ordered.      Relevant Orders   US SOFT TISSUE UPPER EXTREMITY LIMITED LEFT (NON-VASCULAR)   [place] RT UPPER EXTREM LTD SOFT TISSUE NON VASCULAR   Obesity - Primary    Discussed referral to the weight management clinic in [place] as they may be able to provide additional and more in-depth counseling and treatment options.  Referral placed.  Discussed lab evaluation to evaluate for hormonal contributing factors to his difficulty losing weight.      Relevant Orders   Cortisol   Testosterone   Amb Ref to Medical Weight Management   Subclinical hyperthyroidism   Relevant Orders   TSH   T4, free   Other Visit Diagnoses     Prostate cancer screening       Relevant Orders   PSA       Return in about 1 week (around 08/26/2021) for fasting labs 8 am, 6 months PCP.  This visit occurred during the SARS-CoV-2 public health emergency.  Safety  protocols were in place, including screening questions prior to the visit, additional usage of staff PPE, and extensive cleaning of exam room while observing appropriate contact time as indicated for disinfecting solutions.   I have spent 31 minutes in the care of this patient regarding history taking, documentation, discussion of plan, placing orders, completion of exam.   Tommi Rumps, MD [place]

## 2021-08-19 NOTE — Assessment & Plan Note (Signed)
The patient has a subcutaneous lesion near his right shoulder and over his left elbow.  Discussed that these may represent lipomas though an ultrasound would allow [place] to fully characterize these lesions.  These will be ordered.

## 2021-08-20 ENCOUNTER — Telehealth: Payer: Self-pay | Admitting: Family Medicine

## 2021-08-20 NOTE — Telephone Encounter (Signed)
Lft pt vm to call ofc to sch US's. thanks 

## 2021-08-21 ENCOUNTER — Telehealth: Payer: Self-pay | Admitting: Family Medicine

## 2021-08-21 NOTE — Telephone Encounter (Signed)
Lft pt vm to call ofc to US's. thanks

## 2021-08-27 ENCOUNTER — Ambulatory Visit: Payer: 59

## 2021-09-03 ENCOUNTER — Other Ambulatory Visit (INDEPENDENT_AMBULATORY_CARE_PROVIDER_SITE_OTHER): Payer: 59

## 2021-09-03 ENCOUNTER — Other Ambulatory Visit: Payer: Self-pay

## 2021-09-03 DIAGNOSIS — Z125 Encounter for screening for malignant neoplasm of prostate: Secondary | ICD-10-CM

## 2021-09-03 DIAGNOSIS — E785 Hyperlipidemia, unspecified: Secondary | ICD-10-CM

## 2021-09-03 DIAGNOSIS — Z6834 Body mass index (BMI) 34.0-34.9, adult: Secondary | ICD-10-CM

## 2021-09-03 DIAGNOSIS — E0590 Thyrotoxicosis, unspecified without thyrotoxic crisis or storm: Secondary | ICD-10-CM

## 2021-09-03 NOTE — Addendum Note (Signed)
Addended by: Clearnce Sorrel on: 09/03/2021 08:33 AM   Modules accepted: Orders

## 2021-09-04 LAB — PSA: Prostate Specific Ag, Serum: 1 ng/mL (ref 0.0–4.0)

## 2021-09-04 LAB — COMPREHENSIVE METABOLIC PANEL
ALT: 36 IU/L (ref 0–44)
AST: 28 IU/L (ref 0–40)
Albumin/Globulin Ratio: 2.1 (ref 1.2–2.2)
Albumin: 4.8 g/dL (ref 3.8–4.9)
Alkaline Phosphatase: 74 IU/L (ref 44–121)
BUN/Creatinine Ratio: 10 (ref 9–20)
BUN: 12 mg/dL (ref 6–24)
Bilirubin Total: 1.1 mg/dL (ref 0.0–1.2)
CO2: 24 mmol/L (ref 20–29)
Calcium: 9.7 mg/dL (ref 8.7–10.2)
Chloride: 102 mmol/L (ref 96–106)
Creatinine, Ser: 1.15 mg/dL (ref 0.76–1.27)
Globulin, Total: 2.3 g/dL (ref 1.5–4.5)
Glucose: 98 mg/dL (ref 70–99)
Potassium: 4.7 mmol/L (ref 3.5–5.2)
Sodium: 140 mmol/L (ref 134–144)
Total Protein: 7.1 g/dL (ref 6.0–8.5)
eGFR: 73 mL/min/{1.73_m2} (ref >59)

## 2021-09-04 LAB — LIPID PANEL
Chol/HDL Ratio: 2.4 ratio (ref 0.0–5.0)
Cholesterol, Total: 149 mg/dL (ref 100–199)
HDL: 62 mg/dL (ref >39)
LDL Chol Calc (NIH): 71 mg/dL (ref 0–99)
Triglycerides: 87 mg/dL (ref 0–149)
VLDL Cholesterol Cal: 16 mg/dL (ref 5–40)

## 2021-09-04 LAB — TESTOSTERONE: Testosterone: 519 ng/dL (ref 264–916)

## 2021-09-04 LAB — CORTISOL: Cortisol: 10.3 ug/dL

## 2021-09-04 LAB — T4, FREE: Free T4: 1.55 ng/dL (ref 0.82–1.77)

## 2021-09-04 LAB — TSH: TSH: 0.431 u[IU]/mL — ABNORMAL LOW (ref 0.450–4.500)

## 2021-09-28 ENCOUNTER — Telehealth: Payer: Self-pay

## 2021-09-28 ENCOUNTER — Telehealth: Payer: Self-pay | Admitting: Family Medicine

## 2021-09-28 DIAGNOSIS — E669 Obesity, unspecified: Secondary | ICD-10-CM

## 2021-09-28 NOTE — Telephone Encounter (Signed)
I called and spoke with the patient and informed him of his last lab results and he understood. Deundra Furber,cma

## 2021-09-28 NOTE — Telephone Encounter (Signed)
Pt called in stating Dr. Birdie Sons requested Pt to do a extensive lab work order about a few weeks ago. Pt stated that no one called him about results of the lab order. Pt requesting callback.

## 2021-09-28 NOTE — Telephone Encounter (Signed)
I am not sure what happened with the referral.  It was placed previously.  I placed another referral.  I sent him a MyChart result message regarding his results.  Please look at that and relay them to him.  Thanks.

## 2021-09-29 NOTE — Telephone Encounter (Signed)
I gave patient the results and I informed him that the referral would be put in and he would receive a call for scheduling and he understood.  Norville Dani,cma

## 2021-09-30 ENCOUNTER — Other Ambulatory Visit: Payer: Self-pay | Admitting: Family Medicine

## 2021-09-30 DIAGNOSIS — E7800 Pure hypercholesterolemia, unspecified: Secondary | ICD-10-CM

## 2022-02-17 ENCOUNTER — Ambulatory Visit: Payer: 59 | Admitting: Family Medicine

## 2022-02-17 ENCOUNTER — Encounter: Payer: Self-pay | Admitting: Family Medicine

## 2022-02-17 VITALS — BP 118/70 | HR 79 | Temp 99.1°F | Ht 66.0 in | Wt 209.0 lb

## 2022-02-17 DIAGNOSIS — Z6834 Body mass index (BMI) 34.0-34.9, adult: Secondary | ICD-10-CM

## 2022-02-17 DIAGNOSIS — E7800 Pure hypercholesterolemia, unspecified: Secondary | ICD-10-CM | POA: Diagnosis not present

## 2022-02-17 DIAGNOSIS — Z5181 Encounter for therapeutic drug level monitoring: Secondary | ICD-10-CM | POA: Diagnosis not present

## 2022-02-17 DIAGNOSIS — E6609 Other obesity due to excess calories: Secondary | ICD-10-CM | POA: Diagnosis not present

## 2022-02-17 DIAGNOSIS — L989 Disorder of the skin and subcutaneous tissue, unspecified: Secondary | ICD-10-CM

## 2022-02-17 DIAGNOSIS — E785 Hyperlipidemia, unspecified: Secondary | ICD-10-CM

## 2022-02-17 MED ORDER — EZETIMIBE-SIMVASTATIN 10-40 MG PO TABS: 1.0000 | ORAL_TABLET | Freq: Every day | ORAL | 3 refills | Status: DC

## 2022-02-17 NOTE — Assessment & Plan Note (Addendum)
The patient deferred the prior ultrasound.  The discussed that the one on his shoulder is likely a lipoma though I cannot prove that without imaging.  He was advised to monitor for any changes to the lesion including pain, firmness of the lesion, or growth.  The lesion at his left elbow has resolved.  He has been taking an excessive amount of turmeric.  We will check his liver enzymes.  He was advised to cut the turmeric down to one 500 milligram capsule daily. ?

## 2022-02-17 NOTE — Assessment & Plan Note (Signed)
The patient will continue Vytorin 1 tablet daily. ?

## 2022-02-17 NOTE — Assessment & Plan Note (Signed)
Congratulated on weight loss.  Encouraged continued exercise and dietary changes. ?

## 2022-02-17 NOTE — Patient Instructions (Signed)
Nice to see you. ?We will check your liver enzymes and contact you with the results. ?Please keep an eye on the lipoma on your shoulder.  If it changes as we discussed please let us know.  If your left elbow starts to bother you again please let me know. ?

## 2022-02-17 NOTE — Progress Notes (Signed)
?Tommi Rumps, MD ?Phone: [phone] ? ?Erik Weaver is a 61 y.o. male who presents today for follow-up. ? ?Obesity: Patient has been calorie counting and is eating 1700-1800 cal/day.  He is lifting weights and walking for exercise. ? ?Hyperlipidemia: Continues on Vytorin.  No chest pain, claudication, right of quadrant pain, or myalgias. ? 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  It has not changed recently.  There is no tenderness. ? ?Left elbow lesion: Patient notes this seems to have resolved.  He notes his elbow got sore though he has been using turmeric 3600 mg daily, heat, and ibuprofen.  He is also been wearing a band on his left elbow with good benefit. ? ?Social History  ? ?Tobacco Use  ?Smoking Status Former  ?Smokeless Tobacco Former  ? Types: Chew  ? ? ?Current Outpatient Medications on File Prior to Visit  ?Medication Sig Dispense Refill  ? ARGININE PO Take by mouth.    ? aspirin EC 81 MG tablet Take by mouth.    ? Biotin 10 MG CAPS Take by mouth.    ? calcium carbonate (OS-CAL) 600 MG TABS tablet Take by mouth.    ? Coenzyme Q10 400 MG CAPS Take by mouth.    ? Cyanocobalamin 2500 MCG SUBL Place under the tongue.    ? GLUTAMINE PO Take by mouth.    ? Magnesium Oxide, Antacid, 500 MG CAPS Take by mouth.    ? Multiple Vitamins-Minerals (MULTIVITAMIN ADULT PO) Take by mouth.    ? NIACIN CR PO Take by mouth.    ? UNABLE TO FIND Take by mouth.    ? vitamin E 100 UNIT capsule Take by mouth.    ? ?No current facility-administered medications on file prior to visit.  ? ? ? ?ROS see history of present illness ? ?Objective ? ?Physical Exam ?Vitals:  ? 02/17/22 1603  ?BP: 118/70  ?Pulse: 79  ?Temp: 99.1 ?F (37.3 ?C)  ?SpO2: 97%  ? ? ?BP Readings from Last 3 Encounters:  ?02/17/22 118/70  ?08/19/21 110/80  ?02/23/21 110/70  ? ?Wt Readings from Last 3 Encounters:  ?02/17/22 209 lb (94.8 kg)  ?08/19/21 214 lb 6.4 oz (97.3 kg)  ?02/23/21 215 lb 3.2 oz (97.6 kg)   ? ? ?Physical Exam ?Constitutional:   ?   General: He is not in acute distress. ?   Appearance: He is not diaphoretic.  ?Cardiovascular:  ?   Rate and Rhythm: Normal rate and regular rhythm.  ?   Heart sounds: Normal heart sounds.  ?Pulmonary:  ?   Effort: Pulmonary effort is normal.  ?   Breath sounds: Normal breath sounds.  ?Musculoskeletal:  ?   Comments: Right shoulder soft tissue subcutaneous lesion that is soft and nontender, left elbow with no lesion laterally, no tenderness over the lateral epicondyle of the left elbow  ?Skin: ?   General: Skin is warm and dry.  ?Neurological:  ?   Mental Status: He is alert.  ? ? ? ?Assessment/Plan: Please see individual problem list. ? ?Problem List Items Addressed This Visit   ? ? Hyperlipidemia  ?  The patient will continue Vytorin 1 tablet daily. ? ?  ?  ? Relevant Medications  ? ezetimibe-simvastatin (VYTORIN) 10-40 MG tablet  ? Lesion of subcutaneous tissue  ?  The patient deferred the prior ultrasound.  The discussed that the one on his shoulder is likely a lipoma though I cannot prove that without  imaging.  He was advised to monitor for any changes to the lesion including pain, firmness of the lesion, or growth.  The lesion at his left elbow has resolved.  He has been taking an excessive amount of turmeric.  We will check his liver enzymes.  He was advised to cut the turmeric down to one 500 milligram capsule daily. ? ?  ?  ? Obesity  ?  Congratulated on weight loss.  Encouraged continued exercise and dietary changes. ? ?  ?  ? ?Other Visit Diagnoses   ? ? Elevated cholesterol    -  Primary  ? Relevant Medications  ? ezetimibe-simvastatin (VYTORIN) 10-40 MG tablet  ? Other Relevant Orders  ? Hepatic function panel  ? Medication monitoring encounter      ? Relevant Orders  ? Hepatic function panel  ? ?  ? ? ?Return in about 6 months (around 08/20/2022) for CPE. ? ? ?Tommi Rumps, MD ?[place] ? ?

## 2022-02-18 LAB — HEPATIC FUNCTION PANEL
ALT: 28 IU/L (ref 0–44)
AST: 25 IU/L (ref 0–40)
Albumin: 4.8 g/dL (ref 3.8–4.9)
Alkaline Phosphatase: 75 IU/L (ref 44–121)
Bilirubin Total: 0.6 mg/dL (ref 0.0–1.2)
Bilirubin, Direct: 0.16 mg/dL (ref 0.00–0.40)
Total Protein: 7.2 g/dL (ref 6.0–8.5)

## 2022-03-29 ENCOUNTER — Telehealth: Payer: Self-pay | Admitting: Family Medicine

## 2022-03-29 NOTE — Telephone Encounter (Signed)
I called nad spoke with the patient and informed him that his labs were normal and he understood.  Liat Mayol,cma

## 2022-03-29 NOTE — Telephone Encounter (Signed)
Patient would like a phone call back. He states no one ever called him about his lab results from May.

## 2022-08-23 ENCOUNTER — Encounter: Payer: 59 | Admitting: Family Medicine

## 2022-09-28 ENCOUNTER — Ambulatory Visit (INDEPENDENT_AMBULATORY_CARE_PROVIDER_SITE_OTHER): Payer: Self-pay | Admitting: Family Medicine

## 2022-09-28 ENCOUNTER — Encounter: Payer: Self-pay | Admitting: Family Medicine

## 2022-09-28 VITALS — BP 100/60 | HR 74 | Temp 98.8°F | Ht 66.0 in | Wt 198.8 lb

## 2022-09-28 DIAGNOSIS — Z0000 Encounter for general adult medical examination without abnormal findings: Secondary | ICD-10-CM

## 2022-09-28 DIAGNOSIS — Z125 Encounter for screening for malignant neoplasm of prostate: Secondary | ICD-10-CM

## 2022-09-28 DIAGNOSIS — E785 Hyperlipidemia, unspecified: Secondary | ICD-10-CM

## 2022-09-28 DIAGNOSIS — Z6832 Body mass index (BMI) 32.0-32.9, adult: Secondary | ICD-10-CM

## 2022-09-28 DIAGNOSIS — E6609 Other obesity due to excess calories: Secondary | ICD-10-CM

## 2022-09-28 DIAGNOSIS — E0590 Thyrotoxicosis, unspecified without thyrotoxic crisis or storm: Secondary | ICD-10-CM

## 2022-09-28 NOTE — Progress Notes (Addendum)
Marikay Alar, MD Phone: [phone]  Erik Weaver is a 61 y.o. male who presents today for CPE.  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   Routine general medical examination at a health care facility 03/05/2016   Headache 07/02/2016   Allergic rhinitis 12/10/2016   Family history of colon cancer in father 03/17/2018   Vision changes 03/17/2018   Obesity 03/17/2018   Colon cancer screening    Anxiety and depression 09/22/2018   Subclinical hyperthyroidism 01/09/2019   Focal left leg numbness 05/23/2020   Lesion of subcutaneous tissue 08/19/2021   Resolved Ambulatory Problems    Diagnosis Date Noted   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   Establishing care with new doctor, encounter for 10/31/2015   Obesity (BMI 30.0-34.9) 10/31/2015   Foot pain 12/10/2016   Vaccine counseling 03/03/2020   Past Medical History:  Diagnosis Date   Chickenpox    Migraines     Family History  Problem Relation Age of Onset   Alcoholism Unknown        Parent, other relative   Arthritis Unknown        Parent, Other relative    Colon cancer Father    Heart disease Father    Lung cancer Unknown        Grandparent   Hyperlipidemia Unknown        Parent, other relative   Hypertension Unknown        Parent, grandparent, other relative   Diabetes Unknown        Parent, grandparent     Social History   Socioeconomic History   Marital  status: Single    Spouse name: Not on file   Number of children: Not on file   Years of education: Not on file   Highest education level: Not on file  Occupational History   Not on file  Tobacco Use   Smoking status: Former   Smokeless tobacco: Former    Types: Chew  Substance and Sexual Activity   Alcohol use: Yes    Alcohol/week: 0.0 standard drinks of alcohol    Comment: Infrequent   Drug use: No   Sexual activity: Not on file  Other Topics Concern   Not on file  Social History Narrative   Not on file   Social Determinants of Health   Financial Resource Strain: Not on file  Food Insecurity: Not on file  Transportation Needs: Not on file  Physical Activity: Not on file  Stress: Not on file  Social Connections: Not on file  Intimate Partner Violence: Not on file    ROS  General:  Negative for nexplained weight loss, fever Skin: Negative for new or changing mole, sore that won't heal HEENT: Negative for trouble hearing, trouble seeing, ringing in ears, mouth sores, hoarseness, change in voice, dysphagia. CV:  Negative for chest pain, dyspnea, edema, palpitations Resp: Negative for cough, dyspnea, hemoptysis GI: Negative for nausea, vomiting, diarrhea, constipation, abdominal pain, melena, hematochezia. GU:  Negative for dysuria, incontinence, urinary hesitance, hematuria, vaginal or penile discharge, polyuria, sexual difficulty, lumps in testicle or breasts MSK: Negative for muscle cramps or aches, joint pain or swelling Neuro: Negative for headaches, weakness, numbness, dizziness, passing out/fainting Psych: Negative for depression, anxiety, memory problems  Objective  Physical Exam Vitals:   09/28/22 1303  BP: 100/60  Pulse: 74  Temp: 98.8 F (37.1 C)  SpO2: 97%    BP Readings from Last 3 Encounters:  09/28/22 100/60  02/17/22 118/70  08/19/21 110/80   Wt Readings from Last 3 Encounters:  09/28/22 198 lb 12.8 oz (90.2 kg)  02/17/22 209 lb (94.8 kg)   08/19/21 214 lb 6.4 oz (97.3 kg)    Physical Exam Constitutional:      General: He is not in acute distress.    Appearance: He is not diaphoretic.  HENT:     Head: Normocephalic and atraumatic.  Cardiovascular:     Rate and Rhythm: Normal rate and regular rhythm.     Heart sounds: Normal heart sounds.  Pulmonary:     Effort: Pulmonary effort is normal.     Breath sounds: Normal breath sounds.  Abdominal:     General: Bowel sounds are normal. There is no distension.     Palpations: Abdomen is soft.     Tenderness: There is no abdominal tenderness.  Musculoskeletal:     Right lower leg: No edema.     Left lower leg: No edema.  Lymphadenopathy:     Cervical: No cervical adenopathy.  Skin:    General: Skin is warm and dry.  Neurological:     Mental Status: He is alert.  Psychiatric:        Mood and Affect: Mood normal.      Assessment/Plan:   Routine general medical examination at a health care facility Assessment & Plan: Physical exam completed.  Encouraged continued healthy diet and exercise.  Patient request to have his lab work done at his next visit given that he does not currently have any insurance.  He will get the Shingrix vaccine when he is ready to.  He declines further COVID vaccines.  He declines flu vaccination.  Lab work as ordered.  He was encouraged to see the ophthalmologist yearly.   Hyperlipidemia, unspecified hyperlipidemia type -     Comprehensive metabolic panel; Future -     Lipid panel; Future  Subclinical hyperthyroidism -     TSH; Future  Class 1 obesity due to excess calories without serious comorbidity with body mass index (BMI) of 32.0 to 32.9 in adult -     Hemoglobin A1c; Future  Prostate cancer screening -     PSA; Future     Return in about 6 months (around 03/30/2023) for HLD, labs 2 days prior.   Marikay Alar, MD [facility]

## 2022-09-28 NOTE — Assessment & Plan Note (Signed)
Physical exam completed.  Encouraged continued healthy diet and exercise.  Patient request to have his lab work done at his next visit given that he does not currently have any insurance.  He will get the Shingrix vaccine when he is ready to.  He declines further COVID vaccines.  He declines flu vaccination.  Lab work as ordered.  He was encouraged to see the ophthalmologist yearly.

## 2022-09-29 ENCOUNTER — Encounter: Payer: Self-pay | Admitting: Family Medicine

## 2023-03-25 ENCOUNTER — Other Ambulatory Visit: Payer: Self-pay | Admitting: Family Medicine

## 2023-03-25 DIAGNOSIS — E7800 Pure hypercholesterolemia, unspecified: Secondary | ICD-10-CM

## 2023-03-29 ENCOUNTER — Other Ambulatory Visit: Payer: Self-pay

## 2023-04-01 ENCOUNTER — Ambulatory Visit: Payer: Self-pay | Admitting: Family Medicine

## 2023-04-19 ENCOUNTER — Other Ambulatory Visit (INDEPENDENT_AMBULATORY_CARE_PROVIDER_SITE_OTHER): Payer: 59

## 2023-04-19 DIAGNOSIS — E0590 Thyrotoxicosis, unspecified without thyrotoxic crisis or storm: Secondary | ICD-10-CM

## 2023-04-19 DIAGNOSIS — E6609 Other obesity due to excess calories: Secondary | ICD-10-CM

## 2023-04-19 DIAGNOSIS — Z125 Encounter for screening for malignant neoplasm of prostate: Secondary | ICD-10-CM

## 2023-04-19 DIAGNOSIS — E785 Hyperlipidemia, unspecified: Secondary | ICD-10-CM | POA: Diagnosis not present

## 2023-04-19 NOTE — Addendum Note (Signed)
Addended by: Jarvis Morgan D on: 04/19/2023 07:41 AM   Modules accepted: Orders

## 2023-04-20 LAB — TSH: TSH: 0.636 u[IU]/mL (ref 0.450–4.500)

## 2023-04-20 LAB — COMPREHENSIVE METABOLIC PANEL
ALT: 21 IU/L (ref 0–44)
AST: 24 IU/L (ref 0–40)
Albumin: 4.7 g/dL (ref 3.9–4.9)
Alkaline Phosphatase: 86 IU/L (ref 44–121)
BUN/Creatinine Ratio: 11 (ref 10–24)
BUN: 13 mg/dL (ref 8–27)
Bilirubin Total: 1 mg/dL (ref 0.0–1.2)
CO2: 20 mmol/L (ref 20–29)
Calcium: 9.8 mg/dL (ref 8.6–10.2)
Chloride: 102 mmol/L (ref 96–106)
Creatinine, Ser: 1.21 mg/dL (ref 0.76–1.27)
Globulin, Total: 2.5 g/dL (ref 1.5–4.5)
Glucose: 95 mg/dL (ref 70–99)
Potassium: 4 mmol/L (ref 3.5–5.2)
Sodium: 139 mmol/L (ref 134–144)
Total Protein: 7.2 g/dL (ref 6.0–8.5)
eGFR: 68 mL/min/{1.73_m2} (ref >59)

## 2023-04-20 LAB — LIPID PANEL
Chol/HDL Ratio: 2 ratio (ref 0.0–5.0)
Cholesterol, Total: 159 mg/dL (ref 100–199)
HDL: 79 mg/dL (ref >39)
LDL Chol Calc (NIH): 63 mg/dL (ref 0–99)
Triglycerides: 97 mg/dL (ref 0–149)
VLDL Cholesterol Cal: 17 mg/dL (ref 5–40)

## 2023-04-20 LAB — HEMOGLOBIN A1C
Est. average glucose Bld gHb Est-mCnc: 103 mg/dL
Hgb A1c MFr Bld: 5.2 % (ref 4.8–5.6)

## 2023-04-20 LAB — PSA: Prostate Specific Ag, Serum: 1.2 ng/mL (ref 0.0–4.0)

## 2023-04-22 ENCOUNTER — Encounter: Payer: Self-pay | Admitting: Family Medicine

## 2023-04-22 ENCOUNTER — Ambulatory Visit (INDEPENDENT_AMBULATORY_CARE_PROVIDER_SITE_OTHER): Payer: 59 | Admitting: Family Medicine

## 2023-04-22 VITALS — BP 118/72 | HR 85 | Temp 98.3°F | Ht 66.0 in | Wt 196.2 lb

## 2023-04-22 DIAGNOSIS — M25561 Pain in right knee: Secondary | ICD-10-CM | POA: Diagnosis not present

## 2023-04-22 DIAGNOSIS — G8929 Other chronic pain: Secondary | ICD-10-CM

## 2023-04-22 DIAGNOSIS — E6609 Other obesity due to excess calories: Secondary | ICD-10-CM | POA: Diagnosis not present

## 2023-04-22 DIAGNOSIS — Z6831 Body mass index (BMI) 31.0-31.9, adult: Secondary | ICD-10-CM | POA: Diagnosis not present

## 2023-04-22 DIAGNOSIS — K047 Periapical abscess without sinus: Secondary | ICD-10-CM | POA: Insufficient documentation

## 2023-04-22 DIAGNOSIS — E785 Hyperlipidemia, unspecified: Secondary | ICD-10-CM

## 2023-04-22 NOTE — Assessment & Plan Note (Signed)
Chronic intermittent issue.  Benign exam today.  Given that this bothers him so infrequently I do not think there is much to do other than to have him continue to strengthen the muscles in his legs.  Discussed trying to avoid deep squats.  He will let me know if his symptoms worsen at any point.

## 2023-04-22 NOTE — Assessment & Plan Note (Signed)
Chronic issue.  Adequately controlled.  Patient will continue Vytorin 1 tablet daily.

## 2023-04-22 NOTE — Progress Notes (Signed)
Marikay Alar, MD Phone: [phone]  Erik Weaver is a 62 y.o. male who presents today for f/u.  HYPERLIPIDEMIA Symptoms Chest pain on exertion:  no   Leg claudication:   no Medications: Compliance- taking vytorin Right upper quadrant pain- no  Muscle aches- no Lipid Panel     Component Value Date/Time   CHOL 159 04/19/2023 0741   TRIG 97 04/19/2023 0741   HDL 79 04/19/2023 0741   CHOLHDL 2.0 04/19/2023 0741   CHOLHDL 3 12/12/2015 1015   VLDL 26.0 12/12/2015 1015   LDLCALC 63 04/19/2023 0741   LDLDIRECT 87 09/22/2018 0839   LABVLDL 17 04/19/2023 0741   Obesity: Patient notes he has been paying attention to caloric intake.  He has been doing resistance training 5 days a week and walking as well.  Notes some weight loss and body composition change.  Patient does wonder if there is anything we can do diet wise that could affect his testosterone levels.  Last testosterone check in 2022 was within the normal range.  Dental infection: Patient reports a dental infection recently.  Has been on penicillin and plans to have further intervention through his dentist.  Right knee pain: Patient notes he tweaked his knee about 5 years ago.  Occasionally he feels a little discomfort that causes him to pause.  He notes is not consistent.  It does not give out on him.  There is no locking of the knee or popping.  Social History   Tobacco Use  Smoking Status Former  Smokeless Tobacco Former   Types: Chew    Current Outpatient Medications on File Prior to Visit  Medication Sig Dispense Refill   ARGININE PO Take by mouth.     aspirin EC 81 MG tablet Take by mouth.     Biotin 10 MG CAPS Take by mouth.     calcium carbonate (OS-CAL) 600 MG TABS tablet Take by mouth.     Coenzyme Q10 400 MG CAPS Take by mouth.     Cyanocobalamin 2500 MCG SUBL Place under the tongue.     ezetimibe-simvastatin (VYTORIN) 10-40 MG tablet TAKE 1 TABLET BY MOUTH DAILY AT 6 PM. 90 tablet 0   GLUTAMINE PO Take  by mouth.     Magnesium Oxide, Antacid, 500 MG CAPS Take by mouth.     Multiple Vitamins-Minerals (MULTIVITAMIN ADULT PO) Take by mouth.     NIACIN CR PO Take by mouth.     UNABLE TO FIND Take by mouth.     vitamin E 100 UNIT capsule Take by mouth.     No current facility-administered medications on file prior to visit.     ROS see history of present illness  Objective  Physical Exam Vitals:   04/22/23 1343  BP: 118/72  Pulse: 85  Temp: 98.3 F (36.8 C)  SpO2: 95%    BP Readings from Last 3 Encounters:  04/22/23 118/72  09/28/22 100/60  02/17/22 118/70   Wt Readings from Last 3 Encounters:  04/22/23 196 lb 3.2 oz (89 kg)  09/28/22 198 lb 12.8 oz (90.2 kg)  02/17/22 209 lb (94.8 kg)    Physical Exam Constitutional:      General: He is not in acute distress.    Appearance: He is not diaphoretic.  Cardiovascular:     Rate and Rhythm: Normal rate and regular rhythm.     Heart sounds: Normal heart sounds.  Pulmonary:     Effort: Pulmonary effort is normal.  Breath sounds: Normal breath sounds.  Musculoskeletal:     Comments: Right knee with no swelling, warmth, erythema, or tenderness, negative McMurray's in the right knee  Skin:    General: Skin is warm and dry.  Neurological:     Mental Status: He is alert.      Assessment/Plan: Please see individual problem list.  Hyperlipidemia, unspecified hyperlipidemia type Assessment & Plan: Chronic issue.  Adequately controlled.  Patient will continue Vytorin 1 tablet daily.   Class 1 obesity due to excess calories without serious comorbidity with body mass index (BMI) of 31.0 to 31.9 in adult Assessment & Plan: Chronic issue.  Congratulated on weight loss.  Discussed that it seems as though he has lost weight just by looking at him.  I encouraged continued healthy diet and exercise.  Discussed continuing with healthy diet and exercise and minimizing alcohol intake and sugary food intake should help with his  testosterone level.   Chronic pain of right knee Assessment & Plan: Chronic intermittent issue.  Benign exam today.  Given that this bothers him so infrequently I do not think there is much to do other than to have him continue to strengthen the muscles in his legs.  Discussed trying to avoid deep squats.  He will let me know if his symptoms worsen at any point.   Dental infection Assessment & Plan: Patient will continue to follow with his dentist.     Return in about 6 months (around 10/23/2023).   Marikay Alar, MD [facility]

## 2023-04-22 NOTE — Assessment & Plan Note (Signed)
Patient will continue to follow with his dentist.

## 2023-04-22 NOTE — Assessment & Plan Note (Signed)
Chronic issue.  Congratulated on weight loss.  Discussed that it seems as though he has lost weight just by looking at him.  I encouraged continued healthy diet and exercise.  Discussed continuing with healthy diet and exercise and minimizing alcohol intake and sugary food intake should help with his testosterone level.

## 2023-06-16 ENCOUNTER — Other Ambulatory Visit: Payer: Self-pay | Admitting: Family Medicine

## 2023-06-16 DIAGNOSIS — E7800 Pure hypercholesterolemia, unspecified: Secondary | ICD-10-CM

## 2023-08-06 ENCOUNTER — Ambulatory Visit
Admission: EM | Admit: 2023-08-06 | Discharge: 2023-08-06 | Disposition: A | Discharge status: Home / Self Care | Payer: 59 | Attending: Physician Assistant | Admitting: Physician Assistant

## 2023-08-06 ENCOUNTER — Encounter: Payer: Self-pay | Admitting: Physician Assistant

## 2023-08-06 DIAGNOSIS — H00025 Hordeolum internum left lower eyelid: Secondary | ICD-10-CM

## 2023-08-06 MED ORDER — ERYTHROMYCIN 5 MG/GM OP OINT: TOPICAL_OINTMENT | OPHTHALMIC | 0 refills | Status: DC
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
## 2023-08-06 NOTE — Discharge Instructions (Signed)
Continue using warm compresses several times per day for the next week.  Apply erythromycin ointment twice daily for 7 days.  Wash your hands prior to handling this medication and avoid touching tip of medication bottle to the eye to prevent contamination of the medicine.  Follow-up with your ophthalmologist if this is not improving within a week.  If anything worsens and you have swelling of the eye, vision change, fever, pain when you move your eye you need to be seen immediately.

## 2023-09-11 ENCOUNTER — Other Ambulatory Visit: Payer: Self-pay | Admitting: Family Medicine

## 2023-09-11 DIAGNOSIS — E7800 Pure hypercholesterolemia, unspecified: Secondary | ICD-10-CM

## 2023-10-24 ENCOUNTER — Ambulatory Visit: Payer: 59 | Admitting: Family Medicine

## 2023-11-04 ENCOUNTER — Encounter: Payer: Self-pay | Admitting: Nurse Practitioner

## 2023-11-04 ENCOUNTER — Ambulatory Visit: Payer: 59 | Admitting: Nurse Practitioner

## 2023-11-04 VITALS — BP 120/86 | HR 70 | Temp 98.1°F | Ht 66.0 in | Wt 202.0 lb

## 2023-11-04 DIAGNOSIS — L989 Disorder of the skin and subcutaneous tissue, unspecified: Secondary | ICD-10-CM | POA: Diagnosis not present

## 2023-11-04 DIAGNOSIS — Z0000 Encounter for general adult medical examination without abnormal findings: Secondary | ICD-10-CM | POA: Diagnosis not present

## 2023-11-04 DIAGNOSIS — E0590 Thyrotoxicosis, unspecified without thyrotoxic crisis or storm: Secondary | ICD-10-CM

## 2023-11-04 DIAGNOSIS — E785 Hyperlipidemia, unspecified: Secondary | ICD-10-CM | POA: Diagnosis not present

## 2023-11-04 DIAGNOSIS — E669 Obesity, unspecified: Secondary | ICD-10-CM | POA: Diagnosis not present

## 2023-11-04 DIAGNOSIS — Z0001 Encounter for general adult medical examination with abnormal findings: Secondary | ICD-10-CM | POA: Insufficient documentation

## 2023-11-04 DIAGNOSIS — Z1211 Encounter for screening for malignant neoplasm of colon: Secondary | ICD-10-CM

## 2023-11-04 DIAGNOSIS — Z800 Family history of malignant neoplasm of digestive organs: Secondary | ICD-10-CM

## 2023-11-04 NOTE — Assessment & Plan Note (Addendum)
Lipoma-like lesion noted on the right shoulder that has been present for a couple of years with no changes in size or symptoms. Discussed ultrasound for further evaluation. He does have a dermatology appointment scheduled. He would like to discuss the lesion with dermatologist at the upcoming appointment and will consider ultrasound if recommended.

## 2023-11-04 NOTE — Assessment & Plan Note (Signed)
Well controlled on Vytorin with an LDL of 63 in July 2024. No muscle aches or pains reported. Continue Vytorin and repeat lipid panel today. Encouraged to continue healthy diet and exercise.

## 2023-11-04 NOTE — Progress Notes (Signed)
Bethanie Dicker, NP-C Phone: [phone]  Erik Weaver is a 63 y.o. male who presents today for transfer of care and annual exam.   Discussed the use of AI scribe software for clinical note transcription with the patient, who gave verbal consent to proceed.  History of Present Illness   The patient, a retired individual with a history of hyperlipidemia, presented for a routine annual physical and transfer of care. The patient's primary concern was a persistent lipoma-like growth on the right shoulder, present for approximately two years. The patient reported no associated pain or discomfort from this growth.  The patient also reported a history of a similar growth on the left elbow, which resolved spontaneously. The patient expressed a desire to discuss this growth with a dermatologist during an upcoming appointment.  The patient's hyperlipidemia has been well-managed with zetia-simvastatin for many years. The patient reported no side effects from the medication and expressed satisfaction with the current regimen. The patient also takes daily niacin, a decision made after personal research and discussion with a previous healthcare provider.  The patient reported a significant family history of heart disease, which motivated the patient's proactive approach to managing cholesterol levels. The patient reported no symptoms of cardiovascular disease, such as chest pain or shortness of breath.  The patient reported a generally healthy lifestyle, including regular exercise and a balanced diet, although admitted to occasional indulgences. The patient reported a modest weight gain in the past but has since implemented a structured sleep schedule and dietary changes, which have helped maintain a stable weight.  The patient reported no mood disorders, sleep disturbances, or issues with bowel or bladder function. The patient denied any tobacco use and reported only occasional alcohol consumption. The patient  also reported regular dental and eye care.  The patient expressed some hesitancy regarding vaccinations, particularly the flu shot, due to a decrease in social interactions since retirement. The patient reported receiving two COVID-19 vaccinations but has not received any boosters. The patient also expressed interest in receiving the shingles vaccine after further discussion with healthcare providers.  The patient reported no other significant health concerns or changes since the last visit.      Social History   Tobacco Use  Smoking Status Former  Smokeless Tobacco Former   Types: Chew    Current Outpatient Medications on File Prior to Visit  Medication Sig Dispense Refill   ARGININE PO Take by mouth.     aspirin EC 81 MG tablet Take by mouth.     Biotin 10 MG CAPS Take by mouth.     calcium carbonate (OS-CAL) 600 MG TABS tablet Take by mouth.     Coenzyme Q10 400 MG CAPS Take by mouth.     Cyanocobalamin 2500 MCG SUBL Place under the tongue.     erythromycin ophthalmic ointment Place a 1/2 inch ribbon of ointment into the lower eyelid of left eye twice daily for 7 days 3.5 g 0   ezetimibe-simvastatin (VYTORIN) 10-40 MG tablet TAKE 1 TABLET BY MOUTH DAILY AT 6 PM. 90 tablet 0   GLUTAMINE PO Take by mouth.     Magnesium Oxide, Antacid, 500 MG CAPS Take by mouth.     Multiple Vitamins-Minerals (MULTIVITAMIN ADULT PO) Take by mouth.     NIACIN CR PO Take by mouth.     UNABLE TO FIND Take by mouth.     vitamin E 100 UNIT capsule Take by mouth.     No current facility-administered medications on file  prior to visit.    ROS see history of present illness  Objective  Physical Exam Vitals:   11/04/23 0855  BP: 120/86  Pulse: 70  Temp: 98.1 F (36.7 C)  SpO2: 97%    BP Readings from Last 3 Encounters:  11/04/23 120/86  08/06/23 139/77  04/22/23 118/72   Wt Readings from Last 3 Encounters:  11/04/23 202 lb (91.6 kg)  04/22/23 196 lb 3.2 oz (89 kg)  09/28/22 198 lb 12.8  oz (90.2 kg)    Physical Exam Constitutional:      General: He is not in acute distress.    Appearance: Normal appearance.  HENT:     Head: Normocephalic.     Right Ear: Tympanic membrane normal.     Left Ear: Tympanic membrane normal.     Nose: Nose normal.     Mouth/Throat:     Mouth: Mucous membranes are moist.     Pharynx: Oropharynx is clear.  Eyes:     Conjunctiva/sclera: Conjunctivae normal.     Pupils: Pupils are equal, round, and reactive to light.  Neck:     Thyroid: No thyromegaly.  Cardiovascular:     Rate and Rhythm: Normal rate and regular rhythm.     Heart sounds: Normal heart sounds.  Pulmonary:     Effort: Pulmonary effort is normal.     Breath sounds: Normal breath sounds.  Abdominal:     General: Abdomen is flat. Bowel sounds are normal.     Palpations: Abdomen is soft. There is no mass.     Tenderness: There is no abdominal tenderness.  Musculoskeletal:        General: Normal range of motion.  Lymphadenopathy:     Cervical: No cervical adenopathy.  Skin:    General: Skin is warm and dry.     Findings: Lesion (right shoulder- consistent with lipoma, soft, no tenderness, warmth or erythema) present. No rash.  Neurological:     General: No focal deficit present.     Mental Status: He is alert.  Psychiatric:        Mood and Affect: Mood normal.        Behavior: Behavior normal.    Assessment/Plan: Please see individual problem list.  Encounter for routine adult health examination with abnormal findings Assessment & Plan: Physical exam complete. Lab work as outlined. PSA screening completed in July 2024. Last colonoscopy was in July 2019. Due for repeat screening due to family history of colon cancer. Send referral to GI for colonoscopy. Up to date on tetanus vaccine. Politely declined flu vaccine today. He has received 2 COVID vaccines and declines additional. Discussed shingles vaccine. Consider receiving shingles vaccine at a local pharmacy. Continue  regular dental and eye exams. Encouraged healthy diet and exercise. Return to care in one year, sooner as needed.   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 Discussed ultrasound for further evaluation. He does have a dermatology appointment scheduled. He would like to discuss the lesion with dermatologist at the upcoming appointment and will consider ultrasound if recommended.    Hyperlipidemia, unspecified hyperlipidemia type Assessment & Plan: Well controlled on Vytorin with an LDL of 63 in July 2024. No muscle aches or pains reported. Continue Vytorin and repeat lipid panel today.  Encouraged to continue healthy diet and exercise.   Orders: -     Lipid panel  Subclinical hyperthyroidism -     TSH  Obesity (BMI 30-39.9) -     Hemoglobin A1c  Family history of colon cancer in father -     Ambulatory referral to Gastroenterology  Screen for colon cancer -     Ambulatory referral to Gastroenterology    Return in about 1 year (around 11/03/2024) for Annual Exam, sooner as needed.   Bethanie Dicker, NP-C [facility]

## 2023-11-04 NOTE — Assessment & Plan Note (Signed)
Physical exam complete. Lab work as outlined. PSA screening completed in July 2024. Last colonoscopy was in July 2019. Due for repeat screening due to family history of colon cancer. Send referral to GI for colonoscopy. Up to date on tetanus vaccine. Politely declined flu vaccine today. He has received 2 COVID vaccines and declines additional. Discussed shingles vaccine. Consider receiving shingles vaccine at a local pharmacy. Continue regular dental and eye exams. Encouraged healthy diet and exercise. Return to care in one year, sooner as needed.

## 2023-11-05 LAB — COMPREHENSIVE METABOLIC PANEL
ALT: 26 [IU]/L (ref 0–44)
AST: 27 [IU]/L (ref 0–40)
Albumin: 4.7 g/dL (ref 3.9–4.9)
Alkaline Phosphatase: 86 [IU]/L (ref 44–121)
BUN/Creatinine Ratio: 11 (ref 10–24)
BUN: 13 mg/dL (ref 8–27)
Bilirubin Total: 1.1 mg/dL (ref 0.0–1.2)
CO2: 22 mmol/L (ref 20–29)
Calcium: 9.7 mg/dL (ref 8.6–10.2)
Chloride: 102 mmol/L (ref 96–106)
Creatinine, Ser: 1.17 mg/dL (ref 0.76–1.27)
Globulin, Total: 2.4 g/dL (ref 1.5–4.5)
Glucose: 101 mg/dL — ABNORMAL HIGH (ref 70–99)
Potassium: 4 mmol/L (ref 3.5–5.2)
Sodium: 140 mmol/L (ref 134–144)
Total Protein: 7.1 g/dL (ref 6.0–8.5)
eGFR: 71 mL/min/{1.73_m2} (ref >59)

## 2023-11-05 LAB — CBC WITH DIFFERENTIAL/PLATELET
Basophils Absolute: 0.1 10*3/uL (ref 0.0–0.2)
Basos: 1 %
EOS (ABSOLUTE): 0.9 10*3/uL — ABNORMAL HIGH (ref 0.0–0.4)
Eos: 11 %
Hematocrit: 48.9 % (ref 37.5–51.0)
Hemoglobin: 17 g/dL (ref 13.0–17.7)
Immature Grans (Abs): 0 10*3/uL (ref 0.0–0.1)
Immature Granulocytes: 0 %
Lymphocytes Absolute: 2.4 10*3/uL (ref 0.7–3.1)
Lymphs: 31 %
MCH: 33.8 pg — ABNORMAL HIGH (ref 26.6–33.0)
MCHC: 34.8 g/dL (ref 31.5–35.7)
MCV: 97 fL (ref 79–97)
Monocytes Absolute: 0.6 10*3/uL (ref 0.1–0.9)
Monocytes: 7 %
Neutrophils Absolute: 3.7 10*3/uL (ref 1.4–7.0)
Neutrophils: 50 %
Platelets: 177 10*3/uL (ref 150–450)
RBC: 5.03 x10E6/uL (ref 4.14–5.80)
RDW: 12 % (ref 11.6–15.4)
WBC: 7.7 10*3/uL (ref 3.4–10.8)

## 2023-11-05 LAB — LIPID PANEL
Chol/HDL Ratio: 2 {ratio} (ref 0.0–5.0)
Cholesterol, Total: 159 mg/dL (ref 100–199)
HDL: 78 mg/dL (ref >39)
LDL Chol Calc (NIH): 66 mg/dL (ref 0–99)
Triglycerides: 78 mg/dL (ref 0–149)
VLDL Cholesterol Cal: 15 mg/dL (ref 5–40)

## 2023-11-05 LAB — HEMOGLOBIN A1C
Est. average glucose Bld gHb Est-mCnc: 103 mg/dL
Hgb A1c MFr Bld: 5.2 % (ref 4.8–5.6)

## 2023-11-05 LAB — TSH: TSH: 0.623 u[IU]/mL (ref 0.450–4.500)

## 2023-11-07 ENCOUNTER — Encounter: Payer: Self-pay | Admitting: Nurse Practitioner

## 2023-11-12 ENCOUNTER — Ambulatory Visit
Admission: EM | Admit: 2023-11-12 | Discharge: 2023-11-12 | Disposition: A | Discharge status: Home / Self Care | Payer: 59 | Attending: Emergency Medicine | Admitting: Emergency Medicine

## 2023-11-12 DIAGNOSIS — L03213 Periorbital cellulitis: Secondary | ICD-10-CM | POA: Diagnosis not present

## 2023-11-12 MED ORDER — AMOXICILLIN-POT CLAVULANATE 875-125 MG PO TABS: 1.0000 | ORAL_TABLET | Freq: Two times a day (BID) | ORAL | 0 refills | Status: DC

## 2023-11-12 NOTE — Discharge Instructions (Addendum)
Take the Augmentin as directed.  Follow-up with your primary care provider or dermatologist on Monday.

## 2023-11-12 NOTE — ED Triage Notes (Signed)
Pt states he had a mole removed on his left eye lid 4 days ago. Now pt states he is having redness and pain to his left eye. Pt states he put coconut oil on to help with healing on mole removal spot and yesterday he woke up with film on his eye when he woke up and white crusting on his eyelids.

## 2023-11-12 NOTE — ED Provider Notes (Signed)
Erik Weaver    CSN: [id number] Arrival date & time: 11/12/23  0900      History   Chief Complaint Chief Complaint  Patient presents with   Eye Problem    HPI Erik Weaver is a 63 y.o. male.  Patient presents with 1 day history of left eye discomfort and a "film" over his eye when he wakes up in the morning.  The film is resolved with warm washcloth.  His left upper and lower eyelids are slightly red and swollen.  He denies vision changes, eye pain, purulent drainage, fever.  He had a mole removed on his eyelid 4 days ago and has been applying coconut oil to the area.  The history is provided by the patient and medical records.    Past Medical History:  Diagnosis Date   Chickenpox    Hyperlipidemia    Migraines     Patient Active Problem List   Diagnosis Date Noted   Encounter for routine adult health examination with abnormal findings 11/04/2023   Right knee pain 04/22/2023   Dental infection 04/22/2023   Lesion of subcutaneous tissue 08/19/2021   Focal left leg numbness 05/23/2020   Subclinical hyperthyroidism 01/09/2019   Anxiety and depression 09/22/2018   Colon cancer screening    Family history of colon cancer in father 03/17/2018   Vision changes 03/17/2018   Obesity (BMI 30-39.9) 03/17/2018   Allergic rhinitis 12/10/2016   Headache 07/02/2016   Routine general medical examination at a health care facility 03/05/2016   Hyperlipidemia 03/07/2007   ROSACEA 03/07/2007    Past Surgical History:  Procedure Laterality Date   COLONOSCOPY WITH PROPOFOL N/A 05/01/2018   Procedure: COLONOSCOPY WITH PROPOFOL;  Surgeon: Toney Reil, MD;  Location: [facility] ENDOSCOPY;  Service: Gastroenterology;  Laterality: N/A;       Home Medications    Prior to Admission medications   Medication Sig Start Date End Date Taking? Authorizing Provider  amoxicillin-clavulanate (AUGMENTIN) 875-125 MG tablet Take 1 tablet by mouth every 12 (twelve) hours. 11/12/23  Yes  Mickie Bail, NP  ARGININE PO Take by mouth.   Yes [provider]  aspirin EC 81 MG tablet Take by mouth.   Yes [provider]  Biotin 10 MG CAPS Take by mouth.   Yes [provider]  calcium carbonate (OS-CAL) 600 MG TABS tablet Take by mouth.   Yes [provider]  Coenzyme Q10 400 MG CAPS Take by mouth.   Yes [provider]  ezetimibe-simvastatin (VYTORIN) 10-40 MG tablet TAKE 1 TABLET BY MOUTH DAILY AT 6 PM. 09/12/23  Yes Glori Luis, MD  GLUTAMINE PO Take by mouth.   Yes [provider]  Magnesium Oxide, Antacid, 500 MG CAPS Take by mouth.   Yes [provider]  Multiple Vitamins-Minerals (MULTIVITAMIN ADULT PO) Take by mouth.   Yes [provider]  NIACIN CR PO Take by mouth.   Yes [provider]  vitamin E 100 UNIT capsule Take by mouth.   Yes [provider]  Cyanocobalamin 2500 MCG SUBL Place under the tongue. Patient not taking: Reported on 11/12/2023    [provider]  erythromycin ophthalmic ointment Place a 1/2 inch ribbon of ointment into the lower eyelid of left eye twice daily for 7 days 08/06/23   Raspet, Erin K, PA-C  UNABLE TO FIND Take by mouth.    [provider]    Family History Family History  Problem Relation Age of  Onset   Alcoholism Unknown        Parent, other relative   Arthritis Unknown        Parent, Other relative    Colon cancer Father    Heart disease Father    Lung cancer Unknown        Grandparent   Hyperlipidemia Unknown        Parent, other relative   Hypertension Unknown        Parent, grandparent, other relative   Diabetes Unknown        Parent, grandparent     Social History Social History   Tobacco Use   Smoking status: Former   Smokeless tobacco: Former    Types: Chew  Substance Use Topics   Alcohol use: Yes    Alcohol/week: 0.0 standard drinks of alcohol    Comment: Infrequent   Drug use: No      Allergies   Patient has no known allergies.   Review of Systems Review of Systems  Constitutional:  Negative for chills and fever.  Eyes:  Positive for pain and redness. Negative for discharge, itching and visual disturbance.     Physical Exam Triage Vital Signs ED Triage Vitals  Encounter Vitals Group     BP      Systolic BP Percentile      Diastolic BP Percentile      Pulse      Resp      Temp      Temp src      SpO2      Weight      Height      Head Circumference      Peak Flow      Pain Score      Pain Loc      Pain Education      Exclude from Growth Chart    No data found.  Updated Vital Signs BP 115/75   Pulse 63   Temp 98.2 F (36.8 C)   Resp 18   SpO2 98%   Visual Acuity Right Eye Distance: 20/20 Left Eye Distance: 20/20 Bilateral Distance: 20/20  Right Eye Near:   Left Eye Near:    Bilateral Near:     Physical Exam Constitutional:      General: He is not in acute distress. HENT:     Mouth/Throat:     Mouth: Mucous membranes are moist.  Eyes:     General: Vision grossly intact.     Extraocular Movements: Extraocular movements intact.     Pupils: Pupils are equal, round, and reactive to light.     Comments: Mild erythema and edema of left upper and lower eyelids.  Left conjunctiva mildly injected when compared to right.  No drainage.  Cardiovascular:     Rate and Rhythm: Normal rate and regular rhythm.  Pulmonary:     Effort: Pulmonary effort is normal. No respiratory distress.  Neurological:     Mental Status: He is alert.      UC Treatments / Results  Labs (all labs ordered are listed, but only abnormal results are displayed) Labs Reviewed - No data to display  EKG   Radiology No results found.  Procedures Procedures (including critical care time)  Medications Ordered in UC Medications - No data to display  Initial Impression / Assessment and Plan / UC Course  I have reviewed the triage vital signs and the  nursing notes.  Pertinent labs & imaging results that were available during  my care of the patient were reviewed by me and considered in my medical decision making (see chart for details).    Preseptal cellulitis of left eye.  Treating with 7-day course of Augmentin.  ED precautions given.  Instructed patient to follow-up with his PCP or the dermatologist who did his eyelid procedure on Monday.  Education provided on preseptal cellulitis.  Patient agrees to plan of care.  Final Clinical Impressions(s) / UC Diagnoses   Final diagnoses:  Preseptal cellulitis of left eye     Discharge Instructions      Take the Augmentin as directed.  Follow-up with your primary care provider or dermatologist on Monday.     ED Prescriptions     Medication Sig Dispense Auth. Provider   amoxicillin-clavulanate (AUGMENTIN) 875-125 MG tablet Take 1 tablet by mouth every 12 (twelve) hours. 14 tablet Mickie Bail, NP      PDMP not reviewed this encounter.   Mickie Bail, NP 11/12/23 1006

## 2023-11-21 ENCOUNTER — Encounter: Payer: Self-pay | Admitting: *Deleted

## 2023-11-28 ENCOUNTER — Telehealth: Payer: Self-pay

## 2023-11-28 NOTE — Telephone Encounter (Signed)
 Spoken to patient and inform him that he is not due until 05/01/2024. A recall letter will be send in June for patient.  Patient verbalized understanding.

## 2023-11-28 NOTE — Telephone Encounter (Signed)
 Patient left a voicemail stating that he receive a letter he is needing to call to schedule his colonoscopy

## 2023-12-09 ENCOUNTER — Other Ambulatory Visit: Payer: Self-pay | Admitting: Family Medicine

## 2023-12-09 DIAGNOSIS — E7800 Pure hypercholesterolemia, unspecified: Secondary | ICD-10-CM

## 2024-02-21 ENCOUNTER — Ambulatory Visit: Admitting: Nurse Practitioner

## 2024-02-21 VITALS — BP 118/84 | HR 85 | Temp 98.5°F | Ht 66.0 in | Wt 200.8 lb

## 2024-02-21 DIAGNOSIS — S8012XA Contusion of left lower leg, initial encounter: Secondary | ICD-10-CM | POA: Diagnosis not present

## 2024-02-21 NOTE — Progress Notes (Signed)
 Bluford Burkitt, NP-C Phone: [phone]  Erik Weaver is a 63 y.o. male who presents today for left ankle pain.   Discussed the use of AI scribe software for clinical note transcription with the patient, who gave verbal consent to proceed.  History of Present Illness   Erik Weaver is a 63 year old male who presents with left ankle pain.  He has been experiencing left ankle pain for about ten weeks, which is tender to the touch, especially when washing or applying lotion. The pain is only when touched and is absent during walking or daily activities. He walks approximately 5,000 to 6,000 steps daily without issue.  The pain initially started after using a leg extension machine during workouts where the area was pressed up against the machine. He stopped using the leg extension machine, but the tenderness has persisted. He notes a yellowish discoloration in the area, suggesting a possible bruise.  He has not specifically treated the area with ice or anti-inflammatory medications, although he occasionally takes ibuprofen for general aches and pains. No swelling, heat, or other symptoms are noted in the area.      Social History   Tobacco Use  Smoking Status Former  Smokeless Tobacco Former   Types: Chew    Current Outpatient Medications on File Prior to Visit  Medication Sig Dispense Refill   ezetimibe -simvastatin  (VYTORIN ) 10-40 MG tablet TAKE 1 TABLET BY MOUTH DAILY AT 6 PM. 90 tablet 3   amoxicillin -clavulanate (AUGMENTIN ) 875-125 MG tablet Take 1 tablet by mouth every 12 (twelve) hours. 14 tablet 0   ARGININE PO Take by mouth.     aspirin EC 81 MG tablet Take by mouth.     Biotin 10 MG CAPS Take by mouth.     calcium carbonate (OS-CAL) 600 MG TABS tablet Take by mouth.     Coenzyme Q10 400 MG CAPS Take by mouth.     Cyanocobalamin 2500 MCG SUBL Place under the tongue. (Patient not taking: Reported on 11/12/2023)     erythromycin  ophthalmic ointment Place a 1/2 inch ribbon of  ointment into the lower eyelid of left eye twice daily for 7 days 3.5 g 0   GLUTAMINE PO Take by mouth.     Magnesium Oxide, Antacid, 500 MG CAPS Take by mouth.     Multiple Vitamins-Minerals (MULTIVITAMIN ADULT PO) Take by mouth.     NIACIN CR PO Take by mouth.     UNABLE TO FIND Take by mouth.     vitamin E 100 UNIT capsule Take by mouth.     No current facility-administered medications on file prior to visit.     ROS see history of present illness  Objective  Physical Exam Vitals:   02/21/24 1254  BP: 118/84  Pulse: 85  Temp: 98.5 F (36.9 C)  SpO2: 94%    BP Readings from Last 3 Encounters:  02/21/24 118/84  11/12/23 115/75  11/04/23 120/86   Wt Readings from Last 3 Encounters:  02/21/24 200 lb 12.8 oz (91.1 kg)  11/04/23 202 lb (91.6 kg)  04/22/23 196 lb 3.2 oz (89 kg)    Physical Exam Constitutional:      General: He is not in acute distress.    Appearance: Normal appearance.  HENT:     Head: Normocephalic.  Cardiovascular:     Rate and Rhythm: Normal rate and regular rhythm.     Heart sounds: Normal heart sounds.  Pulmonary:     Effort: Pulmonary effort is normal.  Breath sounds: Normal breath sounds.  Musculoskeletal:     Left ankle: Tenderness (small, yellow, healing bruise noted) present.       Legs:  Skin:    General: Skin is warm and dry.  Neurological:     General: No focal deficit present.     Mental Status: He is alert.  Psychiatric:        Mood and Affect: Mood normal.        Behavior: Behavior normal.      Assessment/Plan: Please see individual problem list.  Contusion of left lower leg, initial encounter Assessment & Plan: He experiences intermittent pain near left ankle with using leg extension machine but no pain with ambulation. There are no signs of fracture, and yellow discoloration suggests well healing contusion. Advise icing the affected area and recommend anti-inflammatory medication as needed. Reassess if symptoms  persist.    Return if symptoms worsen or fail to improve.   Bluford Burkitt, NP-C [facility]

## 2024-02-21 NOTE — Assessment & Plan Note (Signed)
 He experiences intermittent pain near left ankle with using leg extension machine but no pain with ambulation. There are no signs of fracture, and yellow discoloration suggests well healing contusion. Advise icing the affected area and recommend anti-inflammatory medication as needed. Reassess if symptoms persist.

## 2024-04-09 ENCOUNTER — Telehealth: Payer: Self-pay

## 2024-04-09 ENCOUNTER — Other Ambulatory Visit: Payer: Self-pay

## 2024-04-09 DIAGNOSIS — Z800 Family history of malignant neoplasm of digestive organs: Secondary | ICD-10-CM

## 2024-04-09 DIAGNOSIS — Z1211 Encounter for screening for malignant neoplasm of colon: Secondary | ICD-10-CM

## 2024-04-09 MED ORDER — NA SULFATE-K SULFATE-MG SULF 17.5-3.13-1.6 GM/177ML PO SOLN
1.0000 | Freq: Once | ORAL | 0 refills | Status: AC
Start: 2024-04-09 — End: 2024-04-09

## 2024-04-09 NOTE — Telephone Encounter (Signed)
 Gastroenterology Pre-Procedure Review  Request Date: 05/22/24 Requesting Physician: Dr. Jinny  PATIENT REVIEW QUESTIONS: The patient responded to the following health history questions as indicated:    1. Are you having any GI issues? no 2. Do you have a personal history of Polyps? yes (last colonoscopy performed with Dr. Unk 05/01/2018 recommended repeat in 5 years due to family history of colon cancer) 3. Do you have a family history of Colon Cancer or Polyps? Father colon cancer 4. Diabetes Mellitus? no 5. Joint replacements in the past 12 months?no 6. Major health problems in the past 3 months?no 7. Any artificial heart valves, MVP, or defibrillator?no    MEDICATIONS & ALLERGIES:    Patient reports the following regarding taking any anticoagulation/antiplatelet therapy:   Plavix, Coumadin, Eliquis, Xarelto, Lovenox, Pradaxa, Brilinta, or Effient? no Aspirin? Yes 81 mg daily  Patient confirms/reports the following medications:  Current Outpatient Medications  Medication Sig Dispense Refill   ezetimibe -simvastatin  (VYTORIN ) 10-40 MG tablet TAKE 1 TABLET BY MOUTH DAILY AT 6 PM. 90 tablet 3   Na Sulfate-K Sulfate-Mg Sulfate concentrate (SUPREP) 17.5-3.13-1.6 GM/177ML SOLN Take 1 kit (354 mLs total) by mouth once for 1 dose. 354 mL 0   amoxicillin -clavulanate (AUGMENTIN ) 875-125 MG tablet Take 1 tablet by mouth every 12 (twelve) hours. 14 tablet 0   ARGININE PO Take by mouth.     aspirin EC 81 MG tablet Take by mouth.     Biotin 10 MG CAPS Take by mouth.     calcium carbonate (OS-CAL) 600 MG TABS tablet Take by mouth.     Coenzyme Q10 400 MG CAPS Take by mouth.     Cyanocobalamin 2500 MCG SUBL Place under the tongue. (Patient not taking: Reported on 11/12/2023)     erythromycin  ophthalmic ointment Place a 1/2 inch ribbon of ointment into the lower eyelid of left eye twice daily for 7 days 3.5 g 0   GLUTAMINE PO Take by mouth.     Magnesium Oxide, Antacid, 500 MG CAPS Take by mouth.      Multiple Vitamins-Minerals (MULTIVITAMIN ADULT PO) Take by mouth.     NIACIN CR PO Take by mouth.     UNABLE TO FIND Take by mouth.     vitamin E 100 UNIT capsule Take by mouth.     No current facility-administered medications for this visit.    Patient confirms/reports the following allergies:  No Known Allergies  No orders of the defined types were placed in this encounter.   AUTHORIZATION INFORMATION Primary Insurance: 1D#: Group #:  Secondary Insurance: 1D#: Group #:  SCHEDULE INFORMATION: Date: 05/22/24 Time: Location: [facility]

## 2024-04-09 NOTE — Telephone Encounter (Signed)
 The patient called in to schedule his colonoscopy.

## 2024-05-15 ENCOUNTER — Telehealth: Payer: Self-pay | Admitting: Emergency Medicine

## 2024-05-15 ENCOUNTER — Ambulatory Visit (INDEPENDENT_AMBULATORY_CARE_PROVIDER_SITE_OTHER)

## 2024-05-15 ENCOUNTER — Encounter: Payer: Self-pay | Admitting: Emergency Medicine

## 2024-05-15 ENCOUNTER — Ambulatory Visit
Admission: EM | Admit: 2024-05-15 | Discharge: 2024-05-15 | Disposition: A | Discharge status: Home / Self Care | Attending: Emergency Medicine | Admitting: Emergency Medicine

## 2024-05-15 DIAGNOSIS — R2242 Localized swelling, mass and lump, left lower limb: Secondary | ICD-10-CM

## 2024-05-15 MED ORDER — CELECOXIB 100 MG PO CAPS: 100.0000 mg | ORAL_CAPSULE | Freq: Every day | ORAL | 0 refills | Status: AC

## 2024-05-15 NOTE — ED Triage Notes (Signed)
 Patient complains of knot on left lower pain leg x 1 year worse with the last week. Patient reports knot is tender to the touch. Has taken Advil last dose on Sunday.

## 2024-05-15 NOTE — ED Provider Notes (Signed)
 CAY RALPH PELT    CSN: [id number] Arrival date & time: 05/15/24  0804      History   Chief Complaint Chief Complaint  Patient presents with   Cyst    HPI Erik Weaver is a 63 y.o. male.   Patient presents for evaluation of a knot to the left lower leg beginning 1 year ago worsening over the past 3 days.  Occurring intermittently, appears spontaneously without injury, trauma or precipitating event.  First noticed after completing a workout on the leg extension machine, discussed with his PCP who advised discontinuation and use of NSAIDs and supportive care.  Symptoms did improve but did not fully resolve.  Initial evaluation primary doctor did not notice discoloration of the skin noting it to be a bruise.  No change in activity recently but endorses that he does work ankle boots while riding his motorcycle but they are not tied against skin.  Past Medical History:  Diagnosis Date   Chickenpox    Hyperlipidemia    Migraines     Patient Active Problem List   Diagnosis Date Noted   Contusion of left lower leg 02/21/2024   Encounter for routine adult health examination with abnormal findings 11/04/2023   Right knee pain 04/22/2023   Dental infection 04/22/2023   Lesion of subcutaneous tissue 08/19/2021   Focal left leg numbness 05/23/2020   Subclinical hyperthyroidism 01/09/2019   Anxiety and depression 09/22/2018   Colon cancer screening    Family history of colon cancer in father 03/17/2018   Vision changes 03/17/2018   Obesity (BMI 30-39.9) 03/17/2018   Allergic rhinitis 12/10/2016   Headache 07/02/2016   Routine general medical examination at a health care facility 03/05/2016   Hyperlipidemia 03/07/2007   ROSACEA 03/07/2007    Past Surgical History:  Procedure Laterality Date   COLONOSCOPY WITH PROPOFOL  N/A 05/01/2018   Procedure: COLONOSCOPY WITH PROPOFOL ;  Surgeon: Unk Corinn Skiff, MD;  Location: [facility] ENDOSCOPY;  Service: Gastroenterology;  Laterality:  N/A;       Home Medications    Prior to Admission medications   Medication Sig Start Date End Date Taking? Authorizing Provider  ezetimibe -simvastatin  (VYTORIN ) 10-40 MG tablet TAKE 1 TABLET BY MOUTH DAILY AT 6 PM. 12/09/23   Gretel App, NP  amoxicillin -clavulanate (AUGMENTIN ) 875-125 MG tablet Take 1 tablet by mouth every 12 (twelve) hours. 11/12/23   Corlis Burnard DEL, NP  ARGININE PO Take by mouth.    [provider]  aspirin EC 81 MG tablet Take by mouth.    [provider]  Biotin 10 MG CAPS Take by mouth.    [provider]  calcium carbonate (OS-CAL) 600 MG TABS tablet Take by mouth.    [provider]  Coenzyme Q10 400 MG CAPS Take by mouth.    [provider]  Cyanocobalamin 2500 MCG SUBL Place under the tongue. Patient not taking: Reported on 11/12/2023    [provider]  erythromycin  ophthalmic ointment Place a 1/2 inch ribbon of ointment into the lower eyelid of left eye twice daily for 7 days 08/06/23   Raspet, Erin K, PA-C  GLUTAMINE PO Take by mouth.    [provider]  Magnesium Oxide, Antacid, 500 MG CAPS Take by mouth.    [provider]  Multiple Vitamins-Minerals (MULTIVITAMIN ADULT PO) Take by mouth.    [provider]  NIACIN CR PO Take by mouth.    [provider]  UNABLE TO FIND Take by mouth.  [provider]  vitamin E 100 UNIT capsule Take by mouth.    [provider]    Family History Family History  Problem Relation Age of Onset   Alcoholism Unknown        Parent, other relative   Arthritis Unknown        Parent, Other relative    Colon cancer Father    Heart disease Father    Lung cancer Unknown        Grandparent   Hyperlipidemia Unknown        Parent, other relative   Hypertension Unknown        Parent, grandparent, other relative   Diabetes Unknown        Parent, grandparent     Social History Social History   Tobacco Use    Smoking status: Former   Smokeless tobacco: Former    Types: Chew  Substance Use Topics   Alcohol use: Yes    Alcohol/week: 0.0 standard drinks of alcohol    Comment: Infrequent   Drug use: No     Allergies   Patient has no known allergies.   Review of Systems Review of Systems   Physical Exam Triage Vital Signs ED Triage Vitals  Encounter Vitals Group     BP 05/15/24 0815 127/85     Girls Systolic BP Percentile --      Girls Diastolic BP Percentile --      Boys Systolic BP Percentile --      Boys Diastolic BP Percentile --      Pulse Rate 05/15/24 0815 65     Resp 05/15/24 0815 18     Temp 05/15/24 0815 98.8 F (37.1 C)     Temp Source 05/15/24 0815 Oral     SpO2 05/15/24 0815 100 %     Weight --      Height --      Head Circumference --      Peak Flow --      Pain Score 05/15/24 0813 0     Pain Loc --      Pain Education --      Exclude from Growth Chart --    No data found.  Updated Vital Signs BP 127/85 (BP Location: Left Arm)   Pulse 65   Temp 98.8 F (37.1 C) (Oral)   Resp 18   SpO2 100%   Visual Acuity Right Eye Distance:   Left Eye Distance:   Bilateral Distance:    Right Eye Near:   Left Eye Near:    Bilateral Near:     Physical Exam Constitutional:      Appearance: Normal appearance.  Eyes:     Extraocular Movements: Extraocular movements intact.  Pulmonary:     Effort: Pulmonary effort is normal.  Musculoskeletal:       Legs:     Comments: Approximately 1 by 0.5 cm area of swelling to the medial aspect of the anterior left shin, tender to palpation, no discoloration of the skin, cool to touch  Approximately 0.5 cm area of swelling to the medial aspect of the anterior left shin, tender to palpation, no discoloration of the skin, cool to touch  Neurological:     Mental Status: He is alert and oriented to person, place, and time.      UC Treatments / Results  Labs (all labs ordered are listed, but only abnormal results are  displayed) Labs Reviewed - No data to display  EKG  Radiology No results found.  Procedures Procedures (including critical care time)  Medications Ordered in UC Medications - No data to display  Initial Impression / Assessment and Plan / UC Course  I have reviewed the triage vital signs and the nursing notes.  Pertinent labs & imaging results that were available during my care of the patient were reviewed by me and considered in my medical decision making (see chart for details).  Mass of left lower leg  Unknown etiology,  initially started greater than 3 months ago, occurring intermittently,, not consistent with cyst, no signs of infection, denies injury and no discoloration of the skin consistent with a contusion, x-ray imaging completed to attempt to visualize, will base treatment plan on results, to notify patient via telephone Final Clinical Impressions(s) / UC Diagnoses   Final diagnoses:  Mass of left lower leg     Discharge Instructions      Your evaluated for the mass to the left lower leg  Completing an x-ray to see if we are able to visualize the area of concern, you will be notified of results via telephone and instructed on how to move forward   ED Prescriptions   None    PDMP not reviewed this encounter.   Teresa Shelba SAUN[place], [place] 05/15/24 [phone]

## 2024-05-15 NOTE — Telephone Encounter (Signed)
 X-ray negative, discussed findings with parent, has been seen relief with use of NSAIDs specifically ibuprofen and ice and heat, at this time we will attempt a prescription NSAID and Celebrex  has been prescribed, to be used consistently for 5 days then as needed and at that time if no improvement patient to follow-up with primary doctor for reevaluation, verbalized understanding

## 2024-05-15 NOTE — Discharge Instructions (Signed)
 Your evaluated for the mass to the left lower leg  Completing an x-ray to see if we are able to visualize the area of concern, you will be notified of results via telephone and instructed on how to move forward

## 2024-05-16 ENCOUNTER — Ambulatory Visit (HOSPITAL_COMMUNITY): Payer: Self-pay

## 2024-05-21 ENCOUNTER — Encounter: Payer: Self-pay | Admitting: Gastroenterology

## 2024-05-22 ENCOUNTER — Ambulatory Visit: Admitting: Anesthesiology

## 2024-05-22 ENCOUNTER — Ambulatory Visit
Admission: RE | Admit: 2024-05-22 | Discharge: 2024-05-22 | Disposition: A | Discharge status: Home / Self Care | Attending: Gastroenterology | Admitting: Gastroenterology

## 2024-05-22 ENCOUNTER — Other Ambulatory Visit: Payer: Self-pay

## 2024-05-22 ENCOUNTER — Encounter
Admission: RE | Disposition: A | Discharge status: Home / Self Care | Payer: Self-pay | Source: Home / Self Care | Attending: Gastroenterology

## 2024-05-22 ENCOUNTER — Encounter: Payer: Self-pay | Admitting: Gastroenterology

## 2024-05-22 DIAGNOSIS — K641 Second degree hemorrhoids: Secondary | ICD-10-CM | POA: Insufficient documentation

## 2024-05-22 DIAGNOSIS — Z1211 Encounter for screening for malignant neoplasm of colon: Secondary | ICD-10-CM | POA: Diagnosis present

## 2024-05-22 DIAGNOSIS — F32A Depression, unspecified: Secondary | ICD-10-CM | POA: Diagnosis not present

## 2024-05-22 DIAGNOSIS — K635 Polyp of colon: Secondary | ICD-10-CM

## 2024-05-22 DIAGNOSIS — D120 Benign neoplasm of cecum: Secondary | ICD-10-CM | POA: Insufficient documentation

## 2024-05-22 DIAGNOSIS — Z87891 Personal history of nicotine dependence: Secondary | ICD-10-CM | POA: Diagnosis not present

## 2024-05-22 DIAGNOSIS — F419 Anxiety disorder, unspecified: Secondary | ICD-10-CM | POA: Diagnosis not present

## 2024-05-22 DIAGNOSIS — D122 Benign neoplasm of ascending colon: Secondary | ICD-10-CM | POA: Diagnosis not present

## 2024-05-22 DIAGNOSIS — Z800 Family history of malignant neoplasm of digestive organs: Secondary | ICD-10-CM | POA: Diagnosis not present

## 2024-05-22 HISTORY — PX: COLONOSCOPY: SHX5424

## 2024-05-22 SURGERY — COLONOSCOPY
Anesthesia: General

## 2024-05-22 MED ORDER — PROPOFOL 10 MG/ML IV BOLUS
INTRAVENOUS | Status: DC | PRN
  Administered 2024-05-22 (×2): 50 mg via INTRAVENOUS

## 2024-05-22 MED ORDER — SODIUM CHLORIDE 0.9 % IV SOLN: INTRAVENOUS | Status: DC

## 2024-05-22 MED ORDER — DEXMEDETOMIDINE HCL IN NACL 80 MCG/20ML IV SOLN
INTRAVENOUS | Status: DC | PRN
Start: 2024-05-22 — End: 2024-05-22
  Administered 2024-05-22: 8 ug via INTRAVENOUS
  Administered 2024-05-22: 12 ug via INTRAVENOUS

## 2024-05-22 MED ORDER — LIDOCAINE HCL (CARDIAC) PF 100 MG/5ML IV SOSY
PREFILLED_SYRINGE | INTRAVENOUS | Status: DC | PRN
  Administered 2024-05-22: 80 mg via INTRAVENOUS

## 2024-05-22 MED ORDER — PROPOFOL 500 MG/50ML IV EMUL
INTRAVENOUS | Status: DC | PRN
  Administered 2024-05-22: 75 ug/kg/min via INTRAVENOUS

## 2024-05-22 NOTE — Op Note (Signed)
 [facility] Patient Name: Erik Weaver Procedure Date: 05/22/2024 8:54 AM MRN: [id number] Account #: [id number] Date of Birth: Mar 31, 1961 Admit Type: Outpatient Age: 63 Room: [facility] ENDO ROOM 4 Gender: Male Note Status: Finalized Instrument Name: Arvis [id number] Procedure:             Colonoscopy Indications:           Family history of colon cancer in a first-degree                         relative Providers:             [name] MD, MD Referring MD:          Leron Glance (Referring MD) Medicines:             Propofol  per Anesthesia Complications:         No immediate complications. Procedure:             Pre-Anesthesia Assessment:                        - Prior to the procedure, a History and Physical was                         performed, and patient medications and allergies were                         reviewed. The patient's tolerance of previous                         anesthesia was also reviewed. The risks and benefits                         of the procedure and the sedation options and risks                         were discussed with the patient. All questions were                         answered, and informed consent was obtained. Prior                         Anticoagulants: The patient has taken no anticoagulant                         or antiplatelet agents. ASA Grade Assessment: II - A                         patient with mild systemic disease. After reviewing                         the risks and benefits, the patient was deemed in                         satisfactory condition to undergo the procedure.                        After obtaining informed consent, the colonoscope was  passed under direct vision. Throughout the procedure,                         the patient's blood pressure, pulse, and oxygen                         saturations were monitored continuously. The                          Colonoscope was introduced through the anus and                         advanced to the the cecum, identified by appendiceal                         orifice and ileocecal valve. The colonoscopy was                         performed without difficulty. The patient tolerated                         the procedure well. The quality of the bowel                         preparation was excellent. Findings:      The perianal and digital rectal examinations were normal.      A 2 mm polyp was found in the cecum. The polyp was sessile. The polyp       was removed with a cold biopsy forceps. Resection and retrieval were       complete.      A 2 mm polyp was found in the ascending colon. The polyp was sessile.       The polyp was removed with a cold biopsy forceps. Resection and       retrieval were complete.      Non-bleeding internal hemorrhoids were found during retroflexion. The       hemorrhoids were Grade II (internal hemorrhoids that prolapse but reduce       spontaneously). Impression:            - One 2 mm polyp in the cecum, removed with a cold                         biopsy forceps. Resected and retrieved.                        - One 2 mm polyp in the ascending colon, removed with                         a cold biopsy forceps. Resected and retrieved.                        - Non-bleeding internal hemorrhoids. Recommendation:        - Discharge patient to home.                        - Resume previous diet.                        - Continue  present medications.                        - Await pathology results.                        - Repeat colonoscopy in 5 years for surveillance. Procedure Code(s):     --- Professional ---                        [id number], Colonoscopy, flexible; with biopsy, single or                         multiple Diagnosis Code(s):     --- Professional ---                        Z80.0, Family history of malignant neoplasm of                         digestive organs                         D12.0, Benign neoplasm of cecum CPT copyright 2022 American Medical Association. All rights reserved. The codes documented in this report are preliminary and upon coder review may  be revised to meet current compliance requirements. [name] MD, MD 05/22/2024 9:23:17 AM This report has been signed electronically. Number of Addenda: 0 Note Initiated On: 05/22/2024 8:54 AM Scope Withdrawal Time: 0 hours 9 minutes 44 seconds  Total Procedure Duration: 0 hours 12 minutes 25 seconds  Estimated Blood Loss:  Estimated blood loss: none.      [facility]

## 2024-05-22 NOTE — Anesthesia Postprocedure Evaluation (Signed)
 Anesthesia Post Note  Patient: Erik Weaver  Procedure(s) Performed: COLONOSCOPY  Patient location during evaluation: PACU Anesthesia Type: General Level of consciousness: awake and alert Pain management: pain level controlled Vital Signs Assessment: post-procedure vital signs reviewed and stable Respiratory status: spontaneous breathing, nonlabored ventilation, respiratory function stable and patient connected to nasal cannula oxygen Cardiovascular status: blood pressure returned to baseline and stable Postop Assessment: no apparent nausea or vomiting Anesthetic complications: no   No notable events documented.   Last Vitals:  Vitals:   05/22/24 0933 05/22/24 0942  BP: 110/72 118/88  Pulse: 68 65  Resp: 15 10  Temp:    SpO2: 96% 98%    Last Pain:  Vitals:   05/22/24 0942  TempSrc:   PainSc: 0-No pain                 Lynwood KANDICE Clause

## 2024-05-22 NOTE — Transfer of Care (Signed)
 Immediate Anesthesia Transfer of Care Note  Patient: Erik Weaver  Procedure(s) Performed: COLONOSCOPY  Patient Location: PACU  Anesthesia Type:General  Level of Consciousness: sedated  Airway & Oxygen Therapy: Patient Spontanous Breathing  Post-op Assessment: Report given to RN and Post -op Vital signs reviewed and stable  Post vital signs: Reviewed and stable  Last Vitals:  Vitals Value Taken Time  BP 93/63 05/22/24 09:23  Temp    Pulse 66 05/22/24 09:23  Resp 14 05/22/24 09:23  SpO2 96 % 05/22/24 09:23  Vitals shown include unfiled device data.  Last Pain:  Vitals:   05/22/24 0811  TempSrc: Temporal  PainSc: 0-No pain         Complications: No notable events documented.

## 2024-05-22 NOTE — Anesthesia Preprocedure Evaluation (Signed)
 Anesthesia Evaluation  Patient identified by MRN, date of birth, ID band Patient awake    Reviewed: Allergy & Precautions, H&P , NPO status , Patient's Chart, lab work & pertinent test results, reviewed documented beta blocker date and time   Airway Mallampati: II   Neck ROM: full    Dental  (+) Poor Dentition   Pulmonary neg pulmonary ROS, former smoker   Pulmonary exam normal        Cardiovascular Exercise Tolerance: Good negative cardio ROS Normal cardiovascular exam Rhythm:regular Rate:Normal     Neuro/Psych  Headaches PSYCHIATRIC DISORDERS Anxiety Depression       GI/Hepatic negative GI ROS, Neg liver ROS,,,  Endo/Other   Hyperthyroidism   Renal/GU negative Renal ROS  negative genitourinary   Musculoskeletal   Abdominal   Peds  Hematology negative hematology ROS (+)   Anesthesia Other Findings Past Medical History: No date: Chickenpox No date: Hyperlipidemia No date: Migraines Past Surgical History: 05/01/2018: COLONOSCOPY WITH PROPOFOL ; N/A     Comment:  Procedure: COLONOSCOPY WITH PROPOFOL ;  Surgeon: Unk Corinn Skiff, MD;  Location: [facility] ENDOSCOPY;  Service:               Gastroenterology;  Laterality: N/A;   Reproductive/Obstetrics negative OB ROS                              Anesthesia Physical Anesthesia Plan  ASA: 2  Anesthesia Plan: General   Post-op Pain Management:    Induction:   PONV Risk Score and Plan:   Airway Management Planned:   Additional Equipment:   Intra-op Plan:   Post-operative Plan:   Informed Consent: I have reviewed the patients History and Physical, chart, labs and discussed the procedure including the risks, benefits and alternatives for the proposed anesthesia with the patient or authorized representative who has indicated his/her understanding and acceptance.     Dental Advisory Given  Plan Discussed with:  CRNA  Anesthesia Plan Comments:         Anesthesia Quick Evaluation

## 2024-05-22 NOTE — H&P (Signed)
 [name], MD [facility] [address] [place], [place] [address] Phone: [phone] Fax : [phone]  Primary Care Physician:  Gretel App, NP Primary Gastroenterologist:  Dr. Copping  Pre-Procedure History & Physical: HPI:  Erik Weaver is a 63 y.o. male is here for a screening colonoscopy.   Past Medical History:  Diagnosis Date   Chickenpox    Hyperlipidemia    Migraines     Past Surgical History:  Procedure Laterality Date   COLONOSCOPY WITH PROPOFOL  N/A 05/01/2018   Procedure: COLONOSCOPY WITH PROPOFOL ;  Surgeon: Unk Corinn Skiff, MD;  Location: [facility] ENDOSCOPY;  Service: Gastroenterology;  Laterality: N/A;    Prior to Admission medications   Medication Sig Start Date End Date Taking? Authorizing Provider  ARGININE PO Take by mouth.   Yes [provider]  aspirin EC 81 MG tablet Take by mouth.   Yes [provider]  Biotin 10 MG CAPS Take by mouth.   Yes [provider]  calcium carbonate (OS-CAL) 600 MG TABS tablet Take by mouth.   Yes [provider]  Coenzyme Q10 400 MG CAPS Take by mouth.   Yes [provider]  ezetimibe -simvastatin  (VYTORIN ) 10-40 MG tablet TAKE 1 TABLET BY MOUTH DAILY AT 6 PM. 12/09/23  Yes Gretel App, NP  GLUTAMINE PO Take by mouth.   Yes [provider]  Magnesium Oxide, Antacid, 500 MG CAPS Take by mouth.   Yes [provider]  Multiple Vitamins-Minerals (MULTIVITAMIN ADULT PO) Take by mouth.   Yes [provider]  NIACIN CR PO Take by mouth.   Yes [provider]  UNABLE TO FIND Take by mouth.   Yes [provider]  vitamin E 100 UNIT capsule Take by mouth.   Yes [provider]  amoxicillin -clavulanate (AUGMENTIN ) 875-125 MG tablet Take 1 tablet by mouth every 12 (twelve) hours. 11/12/23   Corlis Burnard DEL, NP  celecoxib  (CELEBREX ) 100 MG capsule Take 1 capsule (100 mg total) by mouth daily. Patient not taking: Reported on 05/22/2024  05/15/24 06/14/24  Teresa Shelba SAUNDERS, NP  Cyanocobalamin 2500 MCG SUBL Place under the tongue. Patient not taking: Reported on 11/12/2023    [provider]  erythromycin  ophthalmic ointment Place a 1/2 inch ribbon of ointment into the lower eyelid of left eye twice daily for 7 days 08/06/23   Raspet, Rocky MARLA, PA-C    Allergies as of 04/09/2024   (No Known Allergies)    Family History  Problem Relation Age of Onset   Alcoholism Unknown        Parent, other relative   Arthritis Unknown        Parent, Other relative    Colon cancer Father    Heart disease Father    Lung cancer Unknown        Grandparent   Hyperlipidemia Unknown        Parent, other relative   Hypertension Unknown        Parent, grandparent, other relative   Diabetes Unknown        Parent, grandparent     Social History   Socioeconomic History   Marital status: Single    Spouse name: Not on file   Number of children: Not on file   Years of education: Not on file   Highest education level: Associate degree: occupational, Scientist, product/process development, or vocational program  Occupational History   Not on file  Tobacco Use   Smoking status: Former   Smokeless tobacco: Former  Types: Chew  Vaping Use   Vaping status: Never Used  Substance and Sexual Activity   Alcohol use: Yes    Alcohol/week: 0.0 standard drinks of alcohol    Comment: Infrequent   Drug use: No   Sexual activity: Not on file  Other Topics Concern   Not on file  Social History Narrative   Not on file   Social Drivers of Health   Financial Resource Strain: Low Risk  (02/21/2024)   Overall Financial Resource Strain (CARDIA)    Difficulty of Paying Living Expenses: Not hard at all  Food Insecurity: No Food Insecurity (02/21/2024)   Hunger Vital Sign    Worried About Running Out of Food in the Last Year: Never true    Ran Out of Food in the Last Year: Never true  Transportation Needs: No Transportation Needs (02/21/2024)   PRAPARE - Therapist, art (Medical): No    Lack of Transportation (Non-Medical): No  Physical Activity: Sufficiently Active (02/21/2024)   Exercise Vital Sign    Days of Exercise per Week: 5 days    Minutes of Exercise per Session: 60 min  Stress: No Stress Concern Present (02/21/2024)   [employer] of Occupational Health - Occupational Stress Questionnaire    Feeling of Stress : Not at all  Social Connections: Moderately Integrated (02/21/2024)   Social Connection and Isolation Panel    Frequency of Communication with Friends and Family: More than three times a week    Frequency of Social Gatherings with Friends and Family: More than three times a week    Attends Religious Services: 1 to 4 times per year    Active Member of [employer] or Organizations: Yes    Attends Banker Meetings: 1 to 4 times per year    Marital Status: Divorced  Catering manager Violence: Not on file    Review of Systems: See HPI, otherwise negative ROS  Physical Exam: BP (!) 141/89   Pulse 79   Temp 97.8 F (36.6 C) (Temporal)   Resp 18   Ht 5' 6 (1.676 m)   Wt 90.6 kg   SpO2 100%   BMI 32.25 kg/m  General:   Alert,  pleasant and cooperative in NAD Head:  Normocephalic and atraumatic. Neck:  Supple; no masses or thyromegaly. Lungs:  Clear throughout to auscultation.    Heart:  Regular rate and rhythm. Abdomen:  Soft, nontender and nondistended. Normal bowel sounds, without guarding, and without rebound.   Neurologic:  Alert and  oriented x4;  grossly normal neurologically.  Impression/Plan: Erik Weaver is now here to undergo a screening colonoscopy.  Risks, benefits, and alternatives regarding colonoscopy have been reviewed with the patient.  Questions have been answered.  All parties agreeable.

## 2024-05-23 ENCOUNTER — Ambulatory Visit: Payer: Self-pay | Admitting: Gastroenterology

## 2024-05-23 LAB — SURGICAL PATHOLOGY

## 2024-06-21 ENCOUNTER — Ambulatory Visit: Admitting: Nurse Practitioner

## 2024-09-05 NOTE — Telephone Encounter (Signed)
 open in error

## 2024-11-06 ENCOUNTER — Ambulatory Visit (INDEPENDENT_AMBULATORY_CARE_PROVIDER_SITE_OTHER): Payer: 59 | Admitting: Nurse Practitioner

## 2024-11-06 ENCOUNTER — Encounter: Payer: Self-pay | Admitting: Nurse Practitioner

## 2024-11-06 ENCOUNTER — Ambulatory Visit
Admission: RE | Admit: 2024-11-06 | Discharge: 2024-11-06 | Disposition: A | Discharge status: Home / Self Care | Source: Ambulatory Visit | Attending: Nurse Practitioner | Admitting: Nurse Practitioner

## 2024-11-06 ENCOUNTER — Ambulatory Visit
Admission: RE | Admit: 2024-11-06 | Discharge: 2024-11-06 | Disposition: A | Discharge status: Home / Self Care | Attending: Nurse Practitioner | Admitting: Nurse Practitioner

## 2024-11-06 VITALS — BP 120/88 | HR 72 | Temp 97.9°F | Ht 66.0 in | Wt 206.0 lb

## 2024-11-06 DIAGNOSIS — Z0000 Encounter for general adult medical examination without abnormal findings: Secondary | ICD-10-CM | POA: Diagnosis not present

## 2024-11-06 DIAGNOSIS — M25532 Pain in left wrist: Secondary | ICD-10-CM | POA: Diagnosis present

## 2024-11-06 DIAGNOSIS — Z125 Encounter for screening for malignant neoplasm of prostate: Secondary | ICD-10-CM | POA: Diagnosis not present

## 2024-11-06 DIAGNOSIS — E0590 Thyrotoxicosis, unspecified without thyrotoxic crisis or storm: Secondary | ICD-10-CM

## 2024-11-06 DIAGNOSIS — E669 Obesity, unspecified: Secondary | ICD-10-CM | POA: Diagnosis not present

## 2024-11-06 DIAGNOSIS — E785 Hyperlipidemia, unspecified: Secondary | ICD-10-CM | POA: Diagnosis not present

## 2024-11-06 DIAGNOSIS — Z0001 Encounter for general adult medical examination with abnormal findings: Secondary | ICD-10-CM

## 2024-11-06 MED ORDER — EZETIMIBE-SIMVASTATIN 10-40 MG PO TABS: 1.0000 | ORAL_TABLET | Freq: Every day | ORAL | 3 refills | Status: AC

## 2024-11-06 NOTE — Progress Notes (Signed)
 " Erik Glance, NP-C Phone: [phone]  Erik Weaver is a 64 y.o. male who presents today for annual exam.   Discussed the use of AI scribe software for clinical note transcription with the patient, who gave verbal consent to proceed.  History of Present Illness   Erik Weaver is a 63 year old male who presents for annual exam with left wrist pain.  He has been experiencing soreness in his left wrist for the past several months, with increased persistence over the last two weeks. The soreness is localized and exacerbated by movements such as curling or pushing down, but not when curling weights with a dumbbell. He describes the pain as 'crazy' and notes it can be triggered by simple actions like grabbing a Q-tip. No numbness, tingling, pain to the touch, or swelling in the wrist. He has been using a wrist brace periodically for support.  He speculates that increased motorcycle riding might have aggravated the condition. He mentions a past experience with a suspected rotator cuff tear, which was managed with advice to take Advil as needed. He has not experienced any decreased strength but avoids certain movements to prevent soreness and pain.  He is currently taking Zetia  and simvastatin  for cholesterol management without any issues. No chest pain, shortness of breath, abdominal pain, urinary symptoms, headaches, or dizziness. He reports annual dermatology visits and mentions a skin finding that will be evaluated at his upcoming appointment. He maintains a physically active lifestyle, exercising three to four days a week, including push, pull, and cardio exercises. He describes his diet as generally well-balanced, with more meals cooked at home. He does not smoke, drinks alcohol socially, and denies any drug use. He reports good sleep patterns since retiring, averaging around six hours per night.  There is a family history of colon cancer in his father, but no family history of prostate cancer.       Tobacco Use History[1]  Medications Ordered Prior to Encounter[2]   ROS see history of present illness  Objective  Physical Exam Vitals:   11/06/24 0853  BP: 120/88  Pulse: 72  Temp: 97.9 F (36.6 C)  SpO2: 97%    BP Readings from Last 3 Encounters:  11/06/24 120/88  05/22/24 118/88  05/15/24 127/85   Wt Readings from Last 3 Encounters:  11/06/24 206 lb (93.4 kg)  05/22/24 199 lb 12.8 oz (90.6 kg)  02/21/24 200 lb 12.8 oz (91.1 kg)    Physical Exam Constitutional:      General: He is not in acute distress.    Appearance: Normal appearance.  HENT:     Head: Normocephalic.     Right Ear: Tympanic membrane normal.     Left Ear: Tympanic membrane normal.     Nose: Nose normal.     Mouth/Throat:     Mouth: Mucous membranes are moist.     Pharynx: Oropharynx is clear.  Eyes:     Conjunctiva/sclera: Conjunctivae normal.     Pupils: Pupils are equal, round, and reactive to light.  Neck:     Thyroid : No thyromegaly.  Cardiovascular:     Rate and Rhythm: Normal rate and regular rhythm.     Heart sounds: Normal heart sounds.  Pulmonary:     Effort: Pulmonary effort is normal.     Breath sounds: Normal breath sounds.  Abdominal:     General: Abdomen is flat. Bowel sounds are normal.     Palpations: Abdomen is soft. There is no mass.  Tenderness: There is no abdominal tenderness.  Musculoskeletal:     Left wrist: No swelling or tenderness. Decreased range of motion (limited by pain).  Lymphadenopathy:     Cervical: No cervical adenopathy.  Skin:    General: Skin is warm and dry.     Findings: No rash.  Neurological:     General: No focal deficit present.     Mental Status: He is alert.  Psychiatric:        Mood and Affect: Mood normal.        Behavior: Behavior normal.      Assessment/Plan: Please see individual problem list.  Encounter for routine adult health examination with abnormal findings Assessment & Plan: Physical exam complete. We will  check lab work as outlined. Colonoscopy is up to date. PSA screening ordered today in lab work. Politely declines flu, shingles and additional COVID vaccines. Tetanus vaccine is up to date. Continue routine dental and eye exams. Encouraged healthy diet and regular exercise. Return to care in one year, sooner as needed.   Orders: -     Comprehensive metabolic panel with GFR -     CBC with Differential/Platelet  Left wrist pain Assessment & Plan: Chronic pain has persisted for several months and worsened recently, suggesting possible tendonitis or ligament strain. Ordered an x-ray of the left wrist and referred him to orthopedics. Advised use of a wrist brace, ice, heat, and ibuprofen.  Orders: -     DG Wrist Complete Left; Future -     Ambulatory referral to Orthopedics  Hyperlipidemia, unspecified hyperlipidemia type Assessment & Plan: Well controlled on Vytorin . No muscle aches or pains reported. Continue Vytorin  and check lipid panel today. Encouraged to continue healthy diet and exercise.   Orders: -     Lipid panel -     Ezetimibe -Simvastatin ; Take 1 tablet by mouth daily at 6 PM.  Dispense: 90 tablet; Refill: 3  Subclinical hyperthyroidism Assessment & Plan: Asymptomatic. Check TSH.   Orders: -     TSH  Obesity (BMI 30-39.9) -     Hemoglobin A1c  Screening PSA (prostate specific antigen) -     PSA     Return in about 1 year (around 11/06/2025) for Annual Exam, sooner as needed.   Erik Glance, NP-C [facility] [place]     [1]  Social History Tobacco Use  Smoking Status Former  Smokeless Tobacco Former   Types: Chew  [2]  Current Outpatient Medications on File Prior to Visit  Medication Sig Dispense Refill   ARGININE PO Take by mouth.     aspirin EC 81 MG tablet Take by mouth.     Biotin 10 MG CAPS Take by mouth.     calcium carbonate (OS-CAL) 600 MG TABS tablet Take by mouth.     Coenzyme Q10 400 MG CAPS Take by mouth.     GLUTAMINE  PO Take by mouth.     Magnesium Oxide, Antacid, 500 MG CAPS Take by mouth.     Multiple Vitamins-Minerals (MULTIVITAMIN ADULT PO) Take by mouth.     NIACIN CR PO Take by mouth.     vitamin E 100 UNIT capsule Take by mouth.     No current facility-administered medications on file prior to visit.   "

## 2024-11-06 NOTE — Assessment & Plan Note (Signed)
 Chronic pain has persisted for several months and worsened recently, suggesting possible tendonitis or ligament strain. Ordered an x-ray of the left wrist and referred him to orthopedics. Advised use of a wrist brace, ice, heat, and ibuprofen.

## 2024-11-06 NOTE — Assessment & Plan Note (Signed)
 Well controlled on Vytorin . No muscle aches or pains reported. Continue Vytorin  and check lipid panel today. Encouraged to continue healthy diet and exercise.

## 2024-11-06 NOTE — Assessment & Plan Note (Signed)
 Physical exam complete. We will check lab work as outlined. Colonoscopy is up to date. PSA screening ordered today in lab work. Politely declines flu, shingles and additional COVID vaccines. Tetanus vaccine is up to date. Continue routine dental and eye exams. Encouraged healthy diet and regular exercise. Return to care in one year, sooner as needed.

## 2024-11-06 NOTE — Assessment & Plan Note (Signed)
 Asymptomatic. Check TSH.

## 2024-11-07 ENCOUNTER — Ambulatory Visit (INDEPENDENT_AMBULATORY_CARE_PROVIDER_SITE_OTHER)

## 2024-11-07 VITALS — BP 107/69 | HR 76 | Ht 66.0 in | Wt 214.2 lb

## 2024-11-07 DIAGNOSIS — M778 Other enthesopathies, not elsewhere classified: Secondary | ICD-10-CM

## 2024-11-07 LAB — COMPREHENSIVE METABOLIC PANEL WITH GFR
ALT: 37 IU/L (ref 0–44)
AST: 27 IU/L (ref 0–40)
Albumin: 4.8 g/dL (ref 3.9–4.9)
Alkaline Phosphatase: 76 IU/L (ref 47–123)
BUN/Creatinine Ratio: 17 (ref 10–24)
BUN: 19 mg/dL (ref 8–27)
Bilirubin Total: 1.3 mg/dL — ABNORMAL HIGH (ref 0.0–1.2)
CO2: 22 mmol/L (ref 20–29)
Calcium: 10 mg/dL (ref 8.6–10.2)
Chloride: 102 mmol/L (ref 96–106)
Creatinine, Ser: 1.13 mg/dL (ref 0.76–1.27)
Globulin, Total: 2.4 g/dL (ref 1.5–4.5)
Glucose: 98 mg/dL (ref 70–99)
Potassium: 4.4 mmol/L (ref 3.5–5.2)
Sodium: 140 mmol/L (ref 134–144)
Total Protein: 7.2 g/dL (ref 6.0–8.5)
eGFR: 73 mL/min/1.73

## 2024-11-07 LAB — LIPID PANEL
Chol/HDL Ratio: 2.7 ratio (ref 0.0–5.0)
Cholesterol, Total: 187 mg/dL (ref 100–199)
HDL: 69 mg/dL
LDL Chol Calc (NIH): 94 mg/dL (ref 0–99)
Triglycerides: 140 mg/dL (ref 0–149)
VLDL Cholesterol Cal: 24 mg/dL (ref 5–40)

## 2024-11-07 LAB — HEMOGLOBIN A1C
Est. average glucose Bld gHb Est-mCnc: 100 mg/dL
Hgb A1c MFr Bld: 5.1 % (ref 4.8–5.6)

## 2024-11-07 LAB — CBC WITH DIFFERENTIAL/PLATELET
Basophils Absolute: 0.1 x10E3/uL (ref 0.0–0.2)
Basos: 1 %
EOS (ABSOLUTE): 0.5 x10E3/uL — ABNORMAL HIGH (ref 0.0–0.4)
Eos: 6 %
Hematocrit: 48.1 % (ref 37.5–51.0)
Hemoglobin: 17 g/dL (ref 13.0–17.7)
Immature Grans (Abs): 0.1 x10E3/uL (ref 0.0–0.1)
Immature Granulocytes: 1 %
Lymphocytes Absolute: 2.8 x10E3/uL (ref 0.7–3.1)
Lymphs: 32 %
MCH: 34.3 pg — ABNORMAL HIGH (ref 26.6–33.0)
MCHC: 35.3 g/dL (ref 31.5–35.7)
MCV: 97 fL (ref 79–97)
Monocytes Absolute: 0.6 x10E3/uL (ref 0.1–0.9)
Monocytes: 7 %
Neutrophils Absolute: 4.6 x10E3/uL (ref 1.4–7.0)
Neutrophils: 53 %
Platelets: 198 x10E3/uL (ref 150–450)
RBC: 4.96 x10E6/uL (ref 4.14–5.80)
RDW: 12.6 % (ref 11.6–15.4)
WBC: 8.7 x10E3/uL (ref 3.4–10.8)

## 2024-11-07 LAB — PSA: Prostate Specific Ag, Serum: 1.4 ng/mL (ref 0.0–4.0)

## 2024-11-07 LAB — TSH: TSH: 0.496 u[IU]/mL (ref 0.450–4.500)

## 2024-11-07 NOTE — Progress Notes (Signed)
 "  Office Visit Note   Patient: Erik Weaver           Date of Birth: 02-28-1961           MRN: [id number] Visit Date: 11/07/2024              Requested by: Gretel App, NP [address] [place],  [place] [address] PCP: Gretel App, NP   Assessment & Plan: Visit Diagnoses:  1. Left wrist tendinitis     Plan: Natural history and expected course discussed. Questions answered. Rest and ice. Reduction in offending activity discussed. OTC analgesics as needed. Recommend PT and referral given. Follow up prn.  Orders:  No orders of the defined types were placed in this encounter.    Subjective: Chief Complaint: Left wrist pain  HPI Patient is a 64 y.o. year old male who presents with wrist pain involving the left wrist. Onset of the symptoms was 2 weeks ago. Inciting event: Patient began having pain while doing curls in the gym. Current symptoms include pain. Pain is located volarly.  Patient has had no prior wrist problems. Treatment to date: none. Wrist does not hurt at any other time. No numbness or tingling.  Objective: Vital Signs: BP 107/69   Pulse 76   Ht 5' 6 (1.676 m)   Wt 97.2 kg   BMI 34.57 kg/m   Physical Exam Gen: Alert, No Acute Distress left wrist: Skin intact, no erythema or induration noted. NTTP. Pain in volar wrist with resisted wrist flexion. Full wrist ROM. NVID  Imaging: Radiographs personally reviewed by me; reveal no abnormalities of the left wrist.   PMFS History: Patient Active Problem List   Diagnosis Date Noted   Left wrist pain 11/06/2024   Polyp of transverse colon 05/22/2024   Contusion of left lower leg 02/21/2024   Encounter for routine adult health examination with abnormal findings 11/04/2023   Right knee pain 04/22/2023   Dental infection 04/22/2023   Lesion of subcutaneous tissue 08/19/2021   Focal left leg numbness 05/23/2020   Subclinical hyperthyroidism 01/09/2019   Anxiety and depression 09/22/2018   Family  history of colon cancer in father 03/17/2018   Vision changes 03/17/2018   Obesity (BMI 30-39.9) 03/17/2018   Allergic rhinitis 12/10/2016   Headache 07/02/2016   Routine general medical examination at a health care facility 03/05/2016   Hyperlipidemia 03/07/2007   ROSACEA 03/07/2007   Past Medical History:  Diagnosis Date   Chickenpox    Hyperlipidemia    Migraines     Family History  Problem Relation Age of Onset   Alcoholism Unknown        Parent, other relative   Arthritis Unknown        Parent, Other relative    Colon cancer Father    Heart disease Father    Lung cancer Unknown        Grandparent   Hyperlipidemia Unknown        Parent, other relative   Hypertension Unknown        Parent, grandparent, other relative   Diabetes Unknown        Parent, grandparent     Past Surgical History:  Procedure Laterality Date   COLONOSCOPY N/A 05/22/2024   Procedure: COLONOSCOPY;  Surgeon: Jinny Carmine, MD;  Location: [facility] ENDOSCOPY;  Service: Endoscopy;  Laterality: N/A;   COLONOSCOPY WITH PROPOFOL  N/A 05/01/2018   Procedure: COLONOSCOPY WITH PROPOFOL ;  Surgeon: Unk Corinn Skiff, MD;  Location: [facility] ENDOSCOPY;  Service: Gastroenterology;  Laterality: N/A;   Social History   Occupational History   Not on file  Tobacco Use   Smoking status: Former   Smokeless tobacco: Former    Types: Engineer, Drilling   Vaping status: Never Used  Substance and Sexual Activity   Alcohol use: Yes    Alcohol/week: 0.0 standard drinks of alcohol    Comment: Infrequent   Drug use: No   Sexual activity: Not on file   Current Outpatient Medications  Medication Instructions   ARGININE PO Take by mouth.   aspirin EC 81 MG tablet Take by mouth.   Biotin 10 MG CAPS Take by mouth.   calcium carbonate (OS-CAL) 600 MG TABS tablet Take by mouth.   Coenzyme Q10 400 MG CAPS Take by mouth.   ezetimibe -simvastatin  (VYTORIN ) 10-40 MG tablet 1 tablet, Oral, Daily-1800   GLUTAMINE PO Take by mouth.    Magnesium Oxide, Antacid, 500 MG CAPS Take by mouth.   Multiple Vitamins-Minerals (MULTIVITAMIN ADULT PO) Take by mouth.   NIACIN CR PO Take by mouth.   vitamin E 100 UNIT capsule Take by mouth.   Allergies as of 11/07/2024   (No Known Allergies)   "

## 2024-11-13 ENCOUNTER — Ambulatory Visit: Payer: Self-pay | Admitting: Nurse Practitioner

## 2024-11-15 ENCOUNTER — Ambulatory Visit: Admitting: Occupational Therapy

## 2024-11-15 DIAGNOSIS — M778 Other enthesopathies, not elsewhere classified: Secondary | ICD-10-CM | POA: Diagnosis not present

## 2024-11-15 DIAGNOSIS — M6281 Muscle weakness (generalized): Secondary | ICD-10-CM | POA: Diagnosis present

## 2024-11-15 NOTE — Therapy (Signed)
 " OUTPATIENT OCCUPATIONAL THERAPY ORTHO EVALUATION  Patient Name: Erik Weaver MRN: [id number] DOB:[birth year], 64 y.o., male Today's Date: 11/15/2024  PCP: Gretel App, NP REFERRING PROVIDER: Mariah Thamas RAMAN, MD  END OF SESSION:  OT End of Session - 11/15/24 2216     Visit Number 1    Number of Visits 12    Date for Recertification  02/07/25    OT Start Time 0845    OT Stop Time 0930    OT Time Calculation (min) 45 min    Activity Tolerance Patient tolerated treatment well    Behavior During Therapy [facility] for tasks assessed/performed          Past Medical History:  Diagnosis Date   Chickenpox    Hyperlipidemia    Migraines    Past Surgical History:  Procedure Laterality Date   COLONOSCOPY N/A 05/22/2024   Procedure: COLONOSCOPY;  Surgeon: Jinny Carmine, MD;  Location: [facility] ENDOSCOPY;  Service: Endoscopy;  Laterality: N/A;   COLONOSCOPY WITH PROPOFOL  N/A 05/01/2018   Procedure: COLONOSCOPY WITH PROPOFOL ;  Surgeon: Unk Corinn Skiff, MD;  Location: [facility] ENDOSCOPY;  Service: Gastroenterology;  Laterality: N/A;   Patient Active Problem List   Diagnosis Date Noted   Left wrist pain 11/06/2024   Polyp of transverse colon 05/22/2024   Contusion of left lower leg 02/21/2024   Encounter for routine adult health examination with abnormal findings 11/04/2023   Right knee pain 04/22/2023   Dental infection 04/22/2023   Lesion of subcutaneous tissue 08/19/2021   Focal left leg numbness 05/23/2020   Subclinical hyperthyroidism 01/09/2019   Anxiety and depression 09/22/2018   Family history of colon cancer in father 03/17/2018   Vision changes 03/17/2018   Obesity (BMI 30-39.9) 03/17/2018   Allergic rhinitis 12/10/2016   Headache 07/02/2016   Routine general medical examination at a health care facility 03/05/2016   Hyperlipidemia 03/07/2007   ROSACEA 03/07/2007    ONSET DATE: 10/25/24  REFERRING DIAG: Left wrist Tendonitis  THERAPY DIAG:  Muscle weakness  (generalized)  Rationale for Evaluation and Treatment: Rehabilitation  SUBJECTIVE:   SUBJECTIVE STATEMENT: Pt. reports pain in his wrists using weights  Pt accompanied by: self  PERTINENT HISTORY: Pt. Is a 64 y.o. male who reports first starting to experience occasional  pain in his left wrist 8-9 months ago. However approximately 3 weeks ago, Pt. Began experiencing intense left volar wrist pain when lifting weights at the gym. The pain has now affected other areas of his hand, most notably when he moves his hand/wrist through various positions required in anticipation of reaching for items throughout the day.  PRECAUTIONS: None  RED FLAGS: None   WEIGHT BEARING RESTRICTIONS: No  PAIN:  Are you having pain?  11/15/24: 0/10 pain at rest during the initial evaluation, 2/10 dorsal wrist during wrist flexion measurement, and 1-2/10 pain in the volar radial aspect of the wrist during radial deviation measurement -Reports pain is typically 12+/10 at its worst when curling weights with his palms facing up, and generally has 3-/ 5 when the left wrist is in the dart throwers position in anticipation for grasping items.    FALLS: Has patient fallen in last 6 months? No  LIVING ENVIRONMENT: Lives with: lives alone Lives in: House/apartment Stairs: several stairs to enter Has following equipment at home: None  PLOF: Independent  PATIENT GOALS: Pt. Reduce pain, and be able to resume  working out at the gym without pain.  NEXT MD VISIT: Return if needed  OBJECTIVE:  Note: Objective measures were completed at Evaluation unless otherwise noted.  HAND DOMINANCE: Right  ADLs: ADLs:  MAM-20 for musculoskeletal conditions: Date: Eval    : Sum Score: 78/10 1= cannot do, 2=Very hard to do, 3=A little hard to do, 4=Easy to do   Rating Choose only one number (definitions above)  4 Wring a towel  4 2. Open a medicine bottle with a child proof cap/top  4 3. Cut nails with a nail clipper    4 4. Open a wide-mouth bottle previously opened  4 5. Cut meat on a plate  4 6. Tie shoes with laces  4 7. Button clothes (medium sized buttons)  3 8. Pick up a 1/2 full water pitcher  4 9. Zip jacket  4 10. Write 3-4 lines legibly (R hand dominant)  3 11. Turn key (to open a door)  4 12. Take things/cards out of a wallet   4 13. Squeeze toothpaste onto a toothbrush  4 14. Handle/count money (bills and coins)  4 15. Brush or comb hair  4 16. Wash hands  4 17. Use a spoon or fork  4 18. Brush teeth (R hand dominant)  4 19. Dial or key in telephone numbers  4 20. Use hand(s) to eat a sandwich   78/80  Total Sum Score     UPPER EXTREMITY ROM:     Active ROM Right Eval  Left eval  Shoulder flexion WNL WNL  Shoulder abduction WNL WNL  Shoulder adduction    Shoulder extension    Shoulder internal rotation    Shoulder external rotation    Elbow flexion WNL WNL  Elbow extension WNL WNL  Wrist flexion 74 58  Wrist extension 64 56  Wrist ulnar deviation 40 26  Wrist radial deviation 26 16  Wrist pronation 90 89  Wrist supination 78 89  (Blank rows = not tested)  Active ROM Right Eval WFL Left Eval WFL  Thumb MCP (0-60)    Thumb IP (0-80)    Thumb Radial abd/add (0-55)     Thumb Palmar abd/add (0-45)     Thumb Opposition to Small Finger     Index MCP (0-90)     Index PIP (0-100)     Index DIP (0-70)      Long MCP (0-90)      Long PIP (0-100)      Long DIP (0-70)      Ring MCP (0-90)      Ring PIP (0-100)      Ring DIP (0-70)      Little MCP (0-90)      Little PIP (0-100)      Little DIP (0-70)      (Blank rows = not tested)   UPPER EXTREMITY MMT:     MMT Right Eval WFL through available range Left Eval WFL  through available range  Shoulder flexion    Shoulder abduction    Shoulder adduction    Shoulder extension    Shoulder internal rotation    Shoulder external rotation    Middle trapezius    Lower trapezius    Elbow flexion    Elbow  extension    Wrist flexion    Wrist extension    Wrist ulnar deviation    Wrist radial deviation    Wrist pronation    Wrist supination    (Blank rows = not tested)  HAND FUNCTION: Grip strength: Right: 84 lbs; Left: 66 lbs, Lateral pinch: Right:  17 lbs, Left: 15 lbs, and 3 point pinch: Right: 14 lbs, Left: 13 lbs  COORDINATION: 9 Hole Peg test: Right: 21 sec; Left: 22 sec  SENSATION: WFL  EDEMA: N/A  COGNITION: Overall cognitive status: Within functional limits for tasks assessed   TREATMENT DATE: 11/15/24    OT initial evaluation was completed, and Pt. education was provided as indicated below.                                                                                                                           PATIENT EDUCATION: Education details: OT services, POC, goals and ADL/IADL functional Status, tendon glide exercises, and contrast bath Person educated: Patient Education method: Explanation, Demonstration, and Handouts Education comprehension: verbalized understanding and verbal cues required, returned demonstration  HOME EXERCISE PROGRAM:  Continue to assess ongoing need for HEPs, and provide/upgrade as indicated.   -Tendon glide exercises, and contrast bath  GOALS: Goals reviewed with patient? Yes  SHORT TERM GOALS: Target date: 12/27/2024  Pt. Will be independent with HEPs for left wrist ROM, and hand strength Baseline:Eval: No current HEP upon arrival to the initial evaluation Goal status: INITIAL   2. Pt. Will improve pain in the left wrist by 2 grades on the pain scale when using his hand   Baseline:  0/10 pain at rest during the initial evaluation, 2/10 dorsal wrist during wrist flexion measurement, and 1-2/10 pain in the volar radial aspect of the wrist during radial deviation measurement. Reports pain is typically 12+/10 at its worst when curling weights with his palms facing up, and generally has 3-/ 5 when the left wrist is in the dart thrower's  position in anticipation for grasping items.    LONG TERM GOALS: Target date: 02/07/2025  Pt. Will improve left wrist flexion/extension by 5 degrees to improve engagement of it during daily ADL/IADL tasks Baseline: Eval: Left wrist flexion: 58, extension: 56 Goal status: INITIAL  2.  Pt. Will improve left wrist radial/ulnar deviation by 3 degrees in preparation for anticipating  reaching for items with the wrist in dart thrower's position Baseline: Eval: Left radial deviation: 16, ulnar deviation: 26 Goal status: INITIAL  3.  Pt. Will improve left grip strength by 5# of force to be able to securely hold heavy items in his hand Baseline: Eval:  Left grip strength: 66# Pt. Scored picking up a 1/2 full pitcher of water: 3 on MAM-20. Goal status: INITIAL  4.  Pt. Will be able to use the left hand to turn a key to open a door independently without pain. Baseline: Eval:  Pt. Scored Turning a key to open a door 3 on the MAM-20 Goal status: INITIAL  5. Pt. Will identify 2 compensatory/work simplification strategies to implement for left wrist use during ADLs/IADL tasks.  Baseline: Eval: Education to be provided  Goal status: INITIAL   ASSESSMENT:  CLINICAL IMPRESSION:  Patient is a 64 y.o. male who was seen today for occupational therapy evaluation  for left wrist tendonitis.  Pt. Presents with left wrist pain (mostly in the volar aspect of the wrist), limited wrist ROM, and decreased left wrist grip strength which affects his ability to efficiently lift weights in the gym, assume positions needed to anticipate reaching/grasping items with the wrist in the functional dart thrower's position, securely holding heavier items, and turning a key to open a door. MAM-20 sum score: 78/80. Pt. will benefit from OT services to work on improving pain, increasing ROM, and improving left grip strength in order to maximize engagement of the left hand during ADL, and IADL tasks.   PERFORMANCE DEFICITS: in  functional skills including ADLs, IADLs, ROM, strength, and UE functional use, cognitive skills including, and psychosocial skills including environmental adaptation and routines and behaviors.   IMPAIRMENTS: are limiting patient from ADLs, IADLs, and leisure.   COMORBIDITIES: may have co-morbidities  that affects occupational performance. Patient will benefit from skilled OT to address above impairments and improve overall function.  MODIFICATION OR ASSISTANCE TO COMPLETE EVALUATION: Min-Moderate modification of tasks or assist with assess necessary to complete an evaluation.  OT OCCUPATIONAL PROFILE AND HISTORY: Detailed assessment: Review of records and additional review of physical, cognitive, psychosocial history related to current functional performance.  CLINICAL DECISION MAKING: Moderate - several treatment options, min-mod task modification necessary  REHAB POTENTIAL: Good  EVALUATION COMPLEXITY: Moderate      PLAN:  OT FREQUENCY: 1-2x/week  OT DURATION: 12 weeks  PLANNED INTERVENTIONS: 97168 OT Re-evaluation, 97535 self care/ADL training, 02889 therapeutic exercise, 97530 therapeutic activity, 97112 neuromuscular re-education, 97018 paraffin, 02989 moist heat, 97034 contrast bath, 97033 iontophoresis, 97760 Orthotic Initial, 97763 Orthotic/Prosthetic subsequent, passive range of motion, energy conservation, coping strategies training, patient/family education, and DME and/or AE instructions  RECOMMENDED OTHER SERVICES: N/A  CONSULTED AND AGREED WITH PLAN OF CARE: Patient  PLAN FOR NEXT SESSION: Treatment  Richardson Otter, MS, OTR/L  11/15/2024, 10:37 PM   "

## 2024-11-19 ENCOUNTER — Ambulatory Visit

## 2024-11-22 ENCOUNTER — Ambulatory Visit: Admitting: Occupational Therapy

## 2024-11-26 ENCOUNTER — Ambulatory Visit

## 2024-11-28 ENCOUNTER — Ambulatory Visit

## 2024-12-03 ENCOUNTER — Ambulatory Visit

## 2024-12-05 ENCOUNTER — Ambulatory Visit: Admitting: Occupational Therapy

## 2024-12-10 ENCOUNTER — Ambulatory Visit

## 2024-12-12 ENCOUNTER — Ambulatory Visit: Admitting: Occupational Therapy

## 2024-12-18 ENCOUNTER — Ambulatory Visit

## 2024-12-20 ENCOUNTER — Ambulatory Visit

## 2024-12-25 ENCOUNTER — Ambulatory Visit

## 2024-12-27 ENCOUNTER — Ambulatory Visit

## 2025-01-01 ENCOUNTER — Ambulatory Visit

## 2025-01-03 ENCOUNTER — Ambulatory Visit

## 2025-01-08 ENCOUNTER — Ambulatory Visit

## 2025-01-10 ENCOUNTER — Ambulatory Visit

## 2025-01-15 ENCOUNTER — Ambulatory Visit

## 2025-01-17 ENCOUNTER — Ambulatory Visit

## 2025-01-22 ENCOUNTER — Ambulatory Visit

## 2025-01-24 ENCOUNTER — Ambulatory Visit

## 2025-01-29 ENCOUNTER — Ambulatory Visit

## 2025-01-31 ENCOUNTER — Ambulatory Visit

## 2025-02-05 ENCOUNTER — Ambulatory Visit

## 2025-02-07 ENCOUNTER — Ambulatory Visit

## 2025-02-12 ENCOUNTER — Ambulatory Visit

## 2025-02-14 ENCOUNTER — Ambulatory Visit

## 2025-02-19 ENCOUNTER — Ambulatory Visit

## 2025-02-21 ENCOUNTER — Ambulatory Visit

## 2025-02-26 ENCOUNTER — Ambulatory Visit

## 2025-02-28 ENCOUNTER — Ambulatory Visit

## 2025-11-07 ENCOUNTER — Encounter: Admitting: Nurse Practitioner
